# Patient Record
Sex: Female | Born: 1955 | Race: White | Hispanic: No | Marital: Married | Smoking: Never smoker
Health system: Southern US, Community
[De-identification: ages and names within clinical notes are randomized; demographics above are authoritative.]

## PROBLEM LIST (undated history)

## (undated) DIAGNOSIS — K219 Gastro-esophageal reflux disease without esophagitis: Secondary | ICD-10-CM

## (undated) DIAGNOSIS — M8580 Other specified disorders of bone density and structure, unspecified site: Secondary | ICD-10-CM

## (undated) HISTORY — DX: Gastro-esophageal reflux disease without esophagitis: K21.9

## (undated) HISTORY — DX: Other specified disorders of bone density and structure, unspecified site: M85.80

## (undated) HISTORY — PX: OTHER SURGICAL HISTORY: SHX169

---

## 2005-05-15 ENCOUNTER — Ambulatory Visit: Payer: Self-pay | Admitting: Family Medicine

## 2006-04-14 ENCOUNTER — Ambulatory Visit: Payer: Self-pay | Admitting: Unknown Physician Specialty

## 2010-03-26 ENCOUNTER — Ambulatory Visit: Payer: Self-pay | Admitting: Cardiology

## 2011-03-20 ENCOUNTER — Ambulatory Visit: Payer: Self-pay | Admitting: Sports Medicine

## 2011-03-25 ENCOUNTER — Ambulatory Visit (INDEPENDENT_AMBULATORY_CARE_PROVIDER_SITE_OTHER): Payer: BC Managed Care – PPO | Admitting: Sports Medicine

## 2011-03-25 ENCOUNTER — Encounter: Payer: Self-pay | Admitting: *Deleted

## 2011-03-25 VITALS — BP 120/70 | Ht 67.0 in | Wt 130.0 lb

## 2011-03-25 DIAGNOSIS — M2170 Unequal limb length (acquired), unspecified site: Secondary | ICD-10-CM

## 2011-03-25 DIAGNOSIS — M1710 Unilateral primary osteoarthritis, unspecified knee: Secondary | ICD-10-CM

## 2011-03-25 DIAGNOSIS — M79609 Pain in unspecified limb: Secondary | ICD-10-CM

## 2011-03-25 DIAGNOSIS — M79671 Pain in right foot: Secondary | ICD-10-CM | POA: Insufficient documentation

## 2011-03-25 DIAGNOSIS — M1711 Unilateral primary osteoarthritis, right knee: Secondary | ICD-10-CM | POA: Insufficient documentation

## 2011-03-25 NOTE — Assessment & Plan Note (Signed)
We made modifications of her orthotic to some heel lift and also some padding to the right  Her orthotic looks good but if she simply does not have enough relief would make her new more cushioned pair  Try an arch strap for the right foot  This 4-6 weeks but if not improving we will recheck her

## 2011-03-25 NOTE — Patient Instructions (Signed)
Try arch strap and orthotics with added cushions in your work shoes  Please follow up in 1 month if pain persists  Consider trying Devil's claw for arthritis  Do straight leg raises and quad sets for your knee  Glucosamine/chondroitin [employer] at [employer]   Thank you for seeing [place] today!

## 2011-03-25 NOTE — Progress Notes (Signed)
  Subjective:    Patient ID: Caitlin Murray, female    DOB: Nov 19, 1955, 56 y.o.   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

## 2011-03-25 NOTE — Assessment & Plan Note (Signed)
Add some straight leg raises [employer]  Okay to use Voltaren when necessary

## 2011-06-20 ENCOUNTER — Telehealth: Payer: Self-pay | Admitting: Cardiology

## 2011-06-20 NOTE — Telephone Encounter (Signed)
Patient request return call from nurse [phone]  Patient c/o chest & left arm discomfort, SOB over the last several days.  Patient request to talk with nurse to advise, she can be reached at [phone]. I also instructed patient to seek Emergency care if system persist.

## 2011-06-20 NOTE — Telephone Encounter (Signed)
Patient called, stated she went shopping last night,had chest pressure and chest pain.Stated left arm felt tight.Stated lasted most of night.States feels good this morning,no chest pain or pressure.Appointment scheduled with Norma Fredrickson NP 06/24/11.Advised to go to ER if needed.

## 2011-06-24 ENCOUNTER — Other Ambulatory Visit: Payer: Self-pay | Admitting: *Deleted

## 2011-06-24 ENCOUNTER — Encounter: Payer: Self-pay | Admitting: *Deleted

## 2011-06-24 ENCOUNTER — Encounter: Payer: Self-pay | Admitting: Nurse Practitioner

## 2011-06-24 ENCOUNTER — Ambulatory Visit (INDEPENDENT_AMBULATORY_CARE_PROVIDER_SITE_OTHER): Payer: BC Managed Care – PPO | Admitting: Nurse Practitioner

## 2011-06-24 DIAGNOSIS — R0789 Other chest pain: Secondary | ICD-10-CM | POA: Insufficient documentation

## 2011-06-24 DIAGNOSIS — G4730 Sleep apnea, unspecified: Secondary | ICD-10-CM | POA: Insufficient documentation

## 2011-06-24 DIAGNOSIS — R079 Chest pain, unspecified: Secondary | ICD-10-CM

## 2011-06-24 MED ORDER — ASPIRIN EC 81 MG PO TBEC: 81.0000 mg | DELAYED_RELEASE_TABLET | Freq: Every day | ORAL | Status: AC

## 2011-06-24 MED ORDER — OMEPRAZOLE 20 MG PO CPDR: 20.0000 mg | DELAYED_RELEASE_CAPSULE | Freq: Two times a day (BID) | ORAL | Status: DC

## 2011-06-24 NOTE — Progress Notes (Signed)
   Caitlin Murray Date of Birth: 04-09-1955 Medical Record #[id number]  History of Present Illness: Caitlin Murray is seen today for a work in visit. She is seen for Dr. Swaziland. She has a history of GERD, asthma, osteopenia and allergies. She comes in today for evaluation for chest pain. She was last seen in January of 2012.  She notes that last Wednesday night she felt a midsternal chest pressure that was dull. Some radiation down the left arm. No other associated symptoms. She was just walking around leisurely and had been out shopping. It lasted for several hours. No aggravating or alleviating factors. Took an aspirin. Did not check her blood pressure. Went on to sleep that night and felt fine the next day. No recurrence and has none since. Now feels fine. She is concerned about heart disease and wondering how females present with heart related issues. She is normally very active. No symptoms with exertion. She says her cholesterol levels are normal. She has never smoked. Family history is positive for a grandfather having an early MI in his 55's. Strokes are predominant on her mother's side.   Current Outpatient Prescriptions on File Prior to Visit  Medication Sig Dispense Refill  . cetirizine (ZYRTEC) 10 MG tablet Take 10 mg by mouth daily.      . Cholecalciferol (VITAMIN D3) 1000 UNITS CAPS Take 1 capsule by mouth daily.      . mometasone (NASONEX) 50 MCG/ACT nasal spray Place 2 sprays into the nose daily.      Marland Kitchen omeprazole (PRILOSEC) 20 MG capsule Take 1 capsule (20 mg total) by mouth 2 (two) times daily.  60 capsule  6    Allergies  Allergen Reactions  . Demerol   . Dilaudid (Hydromorphone Hcl)     Past Medical History  Diagnosis Date  . Asthma   . GERD (gastroesophageal reflux disease)   . Osteopenia     Past Surgical History  Procedure Date  . Right clavicle repair     History  Smoking status  . Never Smoker   Smokeless tobacco  . Not on file    History  Alcohol Use No     Family History  Problem Relation Age of Onset  . Stroke Mother   . Heart attack Mother   . Emphysema Father   . Hypertension Father   . Hypertension Sister     Review of Systems: The review of systems is positive for rare palpitations. Not dizzy or lightheaded. No syncope. No shortness of breath. No edema. No recent travel. Says she snores a lot and she feels like she has sleep apnea. She has been referred for testing but never followed thru.  All other systems were reviewed and are negative.  Physical Exam: BP 108/78  Pulse 66  Ht 5\' 7"  (1.702 m)  Wt 129 lb (58.514 kg)  BMI 20.20 kg/m2 Patient is very pleasant and in no acute distress. Skin is warm and dry. Color is normal.  HEENT is unremarkable. Normocephalic/atraumatic. PERRL. Sclera are nonicteric. Neck is supple. No masses. No JVD. Lungs are clear. Cardiac exam shows a regular rate and rhythm. Abdomen is soft. Extremities are without edema. Gait and ROM are intact. No gross neurologic deficits noted.  LABORATORY DATA: EKG shows sinus rhythm and is normal.   Assessment / Plan:

## 2011-06-24 NOTE — Assessment & Plan Note (Addendum)
Patient presents with a lone episode of chest pressure last week. Lasted for several hours. Has not recurred. Has had remote stress testing dating back to 2007. Will arrange for GXT. I have asked her to take an aspirin 81 mg daily as well. She does have GERD and I have increased her Prilosec to BID. She says her cholesterol levels are normal and will be having a recheck in the near future.

## 2011-06-24 NOTE — Assessment & Plan Note (Signed)
She is told that she snores significantly. Does not feel rested in the mornings. Has had prior studies arranged but she did not follow thru. She is willing to proceed.

## 2011-06-24 NOTE — Patient Instructions (Addendum)
We are going to arrange for a treadmill test  Start a baby aspirin daily  Increase your Omeprazole to two times a day. I have sent this prescription to the drug store.  We are going to arrange for a sleep study  Call the [facility] Care office at [phone] if you have any questions, problems or concerns.

## 2011-07-11 ENCOUNTER — Ambulatory Visit (INDEPENDENT_AMBULATORY_CARE_PROVIDER_SITE_OTHER): Payer: BC Managed Care – PPO | Admitting: Nurse Practitioner

## 2011-07-11 ENCOUNTER — Encounter: Payer: Self-pay | Admitting: Nurse Practitioner

## 2011-07-11 DIAGNOSIS — R079 Chest pain, unspecified: Secondary | ICD-10-CM

## 2011-07-11 NOTE — Procedures (Signed)
Exercise Treadmill Test  Pre-Exercise Testing Evaluation Rhythm: sinus bradycardia  Rate: 57   PR:  .13 QRS:  .08  QT:  .42 QTc: .41     Test  Exercise Tolerance Test Ordering MD: Peter Swaziland, MD  Interpreting MD: Ward Givens NP  Unique Test No: 1  Treadmill:  1  Indication for ETT: chest pain - rule out ischemia  Contraindication to ETT: No   Stress Modality: exercise - treadmill  Cardiac Imaging Performed: non   Protocol: standard Bruce - maximal  Max BP:  190/84  Max MPHR (bpm):  165 85% MPR (bpm):  140  MPHR obtained (bpm):  166 % MPHR obtained:  100%  Reached 85% MPHR (min:sec):  6:29 Total Exercise Time (min-sec):  10:13  Workload in METS:  12.1 Borg Scale: 15  Reason ETT Terminated:  fatigue    ST Segment Analysis At Rest: normal ST segments - no evidence of significant ST depression With Exercise: non-specific ST changes  Other Information Arrhythmia:  Rare, isolated PVC Angina during ETT:  absent (0) Quality of ETT:  diagnostic  ETT Interpretation:  normal - no evidence of ischemia by ST analysis  Comments: Good exercise tolerance.  Minimal upsloping ST depression II, III, aVF, V3-V6 with exercise.  Hypertensive response to exercise.  Recommendations: F/U Dr. Swaziland.

## 2011-07-12 ENCOUNTER — Telehealth: Payer: Self-pay | Admitting: Cardiology

## 2011-07-12 NOTE — Telephone Encounter (Signed)
I spoke with the patient. It looks as though Elnita Maxwell had tried to call her about the GXT results from yesterday and to make sure the patient had a sleep study scheduled. She is aware of her results and her sleep study is set for 07/15/11.

## 2011-07-12 NOTE — Telephone Encounter (Signed)
Pt rtn call from yesterday °

## 2011-07-15 ENCOUNTER — Ambulatory Visit (HOSPITAL_BASED_OUTPATIENT_CLINIC_OR_DEPARTMENT_OTHER): Payer: BC Managed Care – PPO | Attending: Nurse Practitioner | Admitting: Radiology

## 2011-07-15 VITALS — Ht 67.0 in | Wt 130.0 lb

## 2011-07-15 DIAGNOSIS — G4733 Obstructive sleep apnea (adult) (pediatric): Secondary | ICD-10-CM | POA: Insufficient documentation

## 2011-07-20 DIAGNOSIS — G4733 Obstructive sleep apnea (adult) (pediatric): Secondary | ICD-10-CM

## 2011-07-21 NOTE — Procedures (Signed)
Caitlin Murray, Caitlin Murray               ACCOUNT NO.:  [id number]  MEDICAL RECORD NO.:  [id number]          PATIENT TYPE:  OUT  LOCATION:  SLEEP CENTER                 FACILITY:  [facility]  PHYSICIAN:  Barbaraann Share, MD,FCCPDATE OF BIRTH:  02[birth year]14-1956  DATE OF STUDY:  07/15/2011                           NOCTURNAL POLYSOMNOGRAM  REFERRING PHYSICIAN:  Rosalio Macadamia  REFERRING PHYSICIAN:  Dante Gang, N.P.  INDICATION FOR STUDY:  Hypersomnia with sleep apnea.  EPWORTH SLEEPINESS SCORE:  6.  SLEEP ARCHITECTURE:  The patient had total sleep time of 346 minutes with decreased slow wave sleep as well as REM.  Sleep onset latency was normal at 15 minutes, and REM onset was normal at 77 minutes.  Sleep efficiency was mildly reduced at 87%.  RESPIRATORY DATA:  The patient was found to have 28 apneas and 13 obstructive hypopneas, giving her an apnea/hypopnea index of only 7 events per hour.  The events were more common in the supine position and also during REM.  Moderate snoring was noted throughout.  The patient did not meet split night protocol secondary to the majority of her events occurring after 1 a.m.  OXYGEN DATA:  There was transient O2 desaturation as low as 82% with the patient's obstructive events.  CARDIAC DATA:  Occasional PVC and PAC noted, but no clinically significant arrhythmias were seen.  MOVEMENT/PARASOMNIA:  The patient had no significant leg jerks or other abnormal behaviors noted.  IMPRESSION/RECOMMENDATION: 1. Very mild obstructive sleep apnea/hypopnea syndrome with an AHI of     7 events per hour and oxygen desaturation as low as 82%.  Treatment     for this degree of sleep apnea can include a trial of weight loss     if applicable, upper airway surgery, dental appliance, and also     CPAP.  This mild degree of sleep apnea most likely has very little     impact on her cardiovascular health, and therefore the decision to     treat aggressively should  primarily be based on its impact to her     quality of life 2. Occasional premature atrial contractions and premature ventricular     contractions noted, but no clinically significant arrhythmias were     seen.     Barbaraann Share, MD,FCCP Diplomate, American Board of Sleep Medicine    [facility]/MEDQ  D:  07/20/2011 17:59:29  T:  07/21/2011 01:47:39  Job:  [id number]

## 2011-07-30 ENCOUNTER — Ambulatory Visit: Payer: Self-pay | Admitting: Unknown Physician Specialty

## 2011-07-31 ENCOUNTER — Telehealth: Payer: Self-pay | Admitting: Cardiology

## 2011-07-31 NOTE — Telephone Encounter (Signed)
New msg Pt called and wanted sleep study results she had few weeks ago

## 2011-07-31 NOTE — Telephone Encounter (Signed)
Patient called was told sleep study report not available yet.Patient advised if I have not called her within the next 2 weeks please call back.

## 2011-08-29 ENCOUNTER — Telehealth: Payer: Self-pay | Admitting: Cardiology

## 2011-08-29 NOTE — Telephone Encounter (Signed)
Patient called was told will show sleep study to Norma Fredrickson NP and call her back tomorrow 08/30/11.

## 2011-08-29 NOTE — Telephone Encounter (Signed)
Pt calling re results of sleep study

## 2011-08-30 NOTE — Telephone Encounter (Signed)
Patient called no answer.Left message on personal voice mail sleep study has not been read yet, will call her back when it has been read.

## 2011-09-09 ENCOUNTER — Telehealth: Payer: Self-pay | Admitting: Cardiology

## 2011-09-09 NOTE — Telephone Encounter (Signed)
Patient called was told Dr.Jordan not in office this afternoon.Will show Dr.Jordan sleep study report when he is office this week and will call her back.

## 2011-09-09 NOTE — Telephone Encounter (Signed)
New msg Pt wants to know about sleep study results

## 2011-09-11 ENCOUNTER — Telehealth: Payer: Self-pay

## 2011-09-11 NOTE — Telephone Encounter (Signed)
Patient called was told sleep study revealed mild obstructive sleep apnea.No treatment except loss weight.Patient stated she only weighed 130 lbs.Patient requested a copy of report be faxed to Dr.Martin Senior in [place].

## 2011-09-12 ENCOUNTER — Telehealth: Payer: Self-pay | Admitting: Cardiology

## 2011-09-12 NOTE — Telephone Encounter (Signed)
Sleep study faxed to Dr.Brent Senior at fax # [phone].

## 2011-09-12 NOTE — Telephone Encounter (Signed)
Patient called, sleep study results should be faxed to Dr. Kipp Brood Senior Fax # [phone].

## 2011-09-12 NOTE — Telephone Encounter (Signed)
Already spoke to patient.Dr.Jordan reviewed sleep study which revealed mild sleep apnea.Copy faxed to Dr.Brent Senior in [place].

## 2012-08-04 ENCOUNTER — Other Ambulatory Visit: Payer: Self-pay | Admitting: *Deleted

## 2012-08-04 MED ORDER — OMEPRAZOLE 20 MG PO CPDR: 20.0000 mg | DELAYED_RELEASE_CAPSULE | Freq: Two times a day (BID) | ORAL | Status: DC

## 2013-01-07 ENCOUNTER — Encounter: Payer: Self-pay | Admitting: Sports Medicine

## 2013-01-07 ENCOUNTER — Ambulatory Visit (INDEPENDENT_AMBULATORY_CARE_PROVIDER_SITE_OTHER): Payer: BC Managed Care – PPO | Admitting: Sports Medicine

## 2013-01-07 VITALS — BP 113/72 | Ht 67.0 in | Wt 128.0 lb

## 2013-01-07 DIAGNOSIS — M79671 Pain in right foot: Secondary | ICD-10-CM

## 2013-01-07 DIAGNOSIS — M79609 Pain in unspecified limb: Secondary | ICD-10-CM

## 2013-01-07 NOTE — Progress Notes (Signed)
Patient ID: Caitlin Murray, female   DOB: 04-Jan-1956, 57 y.o.   MRN: [id number] Right great toe pain, worse with ambulation.  Progressively worsening for one month.  History of midfoot arthritis.  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  Tried plate in shoe in past which did not provide relief.  Has tried wider shoes with little relief as well.  Denies prior injury.    Past Medical History  Diagnosis Date  . Asthma   . GERD (gastroesophageal reflux disease)   . Osteopenia    Past Surgical History  Procedure Laterality Date  . Right clavicle repair     Allergies  Allergen Reactions  . Demerol   . Dilaudid [Hydromorphone Hcl]    ROS:  As per HPI, otherwise negative.  Examination: BP 113/72  Ht 5\' 7"  (1.702 m)  Wt 128 lb (58.06 kg)  BMI 20.04 kg/m2 Well developed, well nourished 57 you white female A&Ox3.  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  Genu varus knees R>L

## 2013-01-07 NOTE — Assessment & Plan Note (Signed)
1st ray semirigid posting added to insole of boot today in addition to small scaphoid pad. She will bring this in with other insole/boots as needed and re-eval if symptoms do not improve or worsen.

## 2013-02-11 ENCOUNTER — Ambulatory Visit: Payer: BC Managed Care – PPO | Admitting: Sports Medicine

## 2013-02-23 ENCOUNTER — Ambulatory Visit: Payer: BC Managed Care – PPO | Admitting: Sports Medicine

## 2013-07-20 ENCOUNTER — Ambulatory Visit: Payer: BC Managed Care – PPO | Admitting: Sports Medicine

## 2014-01-08 ENCOUNTER — Ambulatory Visit: Payer: Self-pay | Admitting: Physician Assistant

## 2014-01-15 ENCOUNTER — Ambulatory Visit: Payer: Self-pay | Admitting: Emergency Medicine

## 2015-03-27 ENCOUNTER — Ambulatory Visit (INDEPENDENT_AMBULATORY_CARE_PROVIDER_SITE_OTHER): Payer: BC Managed Care – PPO | Admitting: Family Medicine

## 2015-03-27 VITALS — BP 94/66 | HR 92 | Temp 98.3°F | Resp 16 | Ht 67.5 in | Wt 123.0 lb

## 2015-03-27 DIAGNOSIS — J101 Influenza due to other identified influenza virus with other respiratory manifestations: Secondary | ICD-10-CM | POA: Diagnosis not present

## 2015-03-27 LAB — POCT INFLUENZA A/B
INFLUENZA B, POC: NEGATIVE
Influenza A, POC: POSITIVE — AB

## 2015-03-27 MED ORDER — DM-GUAIFENESIN ER 30-600 MG PO TB12: 1.0000 | ORAL_TABLET | Freq: Two times a day (BID) | ORAL | Status: DC

## 2015-03-27 MED ORDER — BENZONATATE 100 MG PO CAPS: 100.0000 mg | ORAL_CAPSULE | Freq: Three times a day (TID) | ORAL | Status: DC | PRN

## 2015-03-27 MED ORDER — OSELTAMIVIR PHOSPHATE 75 MG PO CAPS: 75.0000 mg | ORAL_CAPSULE | Freq: Two times a day (BID) | ORAL | Status: DC

## 2015-03-27 NOTE — Patient Instructions (Signed)

## 2015-03-27 NOTE — Progress Notes (Signed)
Subjective:    Patient ID: Caitlin Murray, female    DOB: 28-Sep-1955, 60 y.o.   MRN: [id number]  HPI: Caitlin Murray is a 60 y.o. female presenting on 03/27/2015 for Cough   HPI  Pt presents for 4 days of fever, chills, body aches, and cough. Symptoms began on Friday. Cough is worse symptoms. Fatigue. Fever 101 at home. She did get flu shot this year.  Home treatment: Decongestant, Advil. No cough medication.   Past Medical History  Diagnosis Date  . Asthma   . GERD (gastroesophageal reflux disease)   . Osteopenia     Current Outpatient Prescriptions on File Prior to Visit  Medication Sig  . mometasone (NASONEX) 50 MCG/ACT nasal spray Place 2 sprays into the nose daily.   No current facility-administered medications on file prior to visit.    Review of Systems  Constitutional: Positive for fever, chills and fatigue.  HENT: Positive for congestion and rhinorrhea. Negative for sore throat and trouble swallowing.   Respiratory: Positive for cough. Negative for chest tightness and wheezing.   Cardiovascular: Negative for chest pain, palpitations and leg swelling.  Gastrointestinal: Negative for nausea, vomiting and diarrhea.  Musculoskeletal: Negative for neck pain and neck stiffness.  Skin: Negative for color change and rash.  Neurological: Positive for headaches.   Per HPI unless specifically indicated above     Objective:    BP 94/66 mmHg  Pulse 92  Temp(Src) 98.3 F (36.8 C) (Oral)  Resp 16  Ht 5' 7.5" (1.715 m)  Wt 123 lb (55.792 kg)  BMI 18.97 kg/m2  SpO2 97%  Wt Readings from Last 3 Encounters:  03/27/15 123 lb (55.792 kg)  01/07/13 128 lb (58.06 kg)  07/15/11 130 lb (58.968 kg)    Physical Exam  Constitutional: She is oriented to person, place, and time. She appears well-developed and well-nourished. She appears ill. No distress.  HENT:  Head: Normocephalic and atraumatic.  Right Ear: Hearing and tympanic membrane normal.  Left Ear: Hearing and  tympanic membrane normal.  Nose: Mucosal edema and rhinorrhea present. Right sinus exhibits no maxillary sinus tenderness and no frontal sinus tenderness. Left sinus exhibits no maxillary sinus tenderness and no frontal sinus tenderness.  Mouth/Throat: Uvula is midline and mucous membranes are normal. Posterior oropharyngeal erythema present.  Neck: Normal range of motion. No erythema present. No Brudzinski's sign and no Kernig's sign noted.  Cardiovascular: Normal rate and regular rhythm.  Exam reveals no gallop and no friction rub.   No murmur heard. Pulmonary/Chest: Effort normal and breath sounds normal. No respiratory distress. She has no decreased breath sounds. She has no wheezes. She has no rhonchi. She has no rales. Chest wall is not dull to percussion. She exhibits no tenderness.  Lymphadenopathy:    She has cervical adenopathy.       Right cervical: Superficial cervical adenopathy present.       Left cervical: Superficial cervical adenopathy present.  Neurological: She is alert and oriented to person, place, and time.  Skin: Skin is warm and dry. No rash noted. She is not diaphoretic. No erythema. No pallor.  Psychiatric: She has a normal mood and affect. Her speech is normal and behavior is normal.   Results for orders placed or performed in visit on 03/27/15  POCT Influenza A/B  Result Value Ref Range   Influenza A, POC Positive (A) Negative   Influenza B, POC Negative Negative      Assessment & Plan:   Problem List  Items Addressed This Visit    None    Visit Diagnoses    Influenza A    -  Primary    Treat symptoms. Trial of tamiflu despite 4 days of symptoms. Cough medication and supportive care. Alarm symptoms reviewed.     Relevant Medications    oseltamivir (TAMIFLU) 75 MG capsule    benzonatate (TESSALON) 100 MG capsule    dextromethorphan-guaiFENesin (MUCINEX DM) 30-600 MG 12hr tablet    Other Relevant Orders    POCT Influenza A/B (Completed)       Meds  ordered this encounter  Medications  . Ibuprofen-Famotidine 800-26.6 MG TABS    Sig: Take by mouth.  . oseltamivir (TAMIFLU) 75 MG capsule    Sig: Take 1 capsule (75 mg total) by mouth 2 (two) times daily.    Dispense:  10 capsule    Refill:  0    Order Specific Question:  Supervising Provider    Answer:  Janeann Forehand [phone]  . benzonatate (TESSALON) 100 MG capsule    Sig: Take 1 capsule (100 mg total) by mouth 3 (three) times daily as needed.    Dispense:  30 capsule    Refill:  0    Order Specific Question:  Supervising Provider    Answer:  Janeann Forehand [[id number]]  . dextromethorphan-guaiFENesin (MUCINEX DM) 30-600 MG 12hr tablet    Sig: Take 1 tablet by mouth 2 (two) times daily.    Dispense:  20 tablet    Refill:  0    Order Specific Question:  Supervising Provider    Answer:  Janeann Forehand [[id number]]      Follow up plan: Return if symptoms worsen or fail to improve.

## 2015-03-29 ENCOUNTER — Telehealth: Payer: Self-pay | Admitting: Family Medicine

## 2015-03-29 ENCOUNTER — Ambulatory Visit
Admission: RE | Admit: 2015-03-29 | Discharge: 2015-03-29 | Disposition: A | Discharge status: Home / Self Care | Payer: BC Managed Care – PPO | Source: Ambulatory Visit | Attending: Family Medicine | Admitting: Family Medicine

## 2015-03-29 ENCOUNTER — Other Ambulatory Visit: Payer: Self-pay | Admitting: *Deleted

## 2015-03-29 DIAGNOSIS — R05 Cough: Secondary | ICD-10-CM

## 2015-03-29 DIAGNOSIS — R059 Cough, unspecified: Secondary | ICD-10-CM

## 2015-03-29 MED ORDER — PROMETHAZINE-CODEINE 6.25-10 MG/5ML PO SYRP: 5.0000 mL | ORAL_SOLUTION | Freq: Three times a day (TID) | ORAL | Status: DC | PRN

## 2015-03-29 NOTE — Telephone Encounter (Signed)
Please advise 

## 2015-03-29 NOTE — Addendum Note (Signed)
Addended by: Alease Frame on: 03/29/2015 03:02 PM   Modules accepted: Orders

## 2015-03-29 NOTE — Telephone Encounter (Signed)
Patient will come in for cxr. She is still having yellow mucus but no fever.

## 2015-03-29 NOTE — Telephone Encounter (Signed)
The flu can cause quite a severe cough. We do however worry about pneumonia developing from the flu.  Is the cough now productive of sputum? Is the sputum green, yellow, or rust colored? Is she still having high fevers? If yes to any of those, we may need to see her again do a chest XR to ensure she does not have pneumonia. We may also need to start an antibiotic to help with her symptoms.   The hoarseness can be caused by viral inflammation with the flu. Warm salt water gargles 4 times daily. Hot drinks such as tea, and losenges can help alleviate the symptoms.

## 2015-03-29 NOTE — Telephone Encounter (Signed)
Pt's husband Roe Coombs said she is still coughing and can't talk because she's so hoarse.  Please call [phone]

## 2015-03-29 NOTE — Telephone Encounter (Signed)
CXR is normal. Pt would like to try medication with codiene for her cough. She can take with no problem in the past. Faxed to pharmacy. If she is still feeling poorly on Friday, we will start an antibiotic to help with her symptoms.

## 2015-03-31 ENCOUNTER — Telehealth: Payer: Self-pay | Admitting: Family Medicine

## 2015-03-31 DIAGNOSIS — J40 Bronchitis, not specified as acute or chronic: Secondary | ICD-10-CM

## 2015-03-31 MED ORDER — AZITHROMYCIN 250 MG PO TABS: ORAL_TABLET | ORAL | Status: DC

## 2015-03-31 MED ORDER — PREDNISONE 20 MG PO TABS: 40.0000 mg | ORAL_TABLET | Freq: Every day | ORAL | Status: DC

## 2015-03-31 NOTE — Telephone Encounter (Signed)
Please let the patient know, that I have sent Azithromycin over to her pharmacy. Take 2 pills today and 1 pill daily until the bottle is empty.

## 2015-03-31 NOTE — Telephone Encounter (Signed)
Pt is still coughing.  Please call [phone]

## 2015-03-31 NOTE — Telephone Encounter (Signed)
Called pt, start prednisone and Zpak. Call on Monday if not improved. Alarm symptoms reviewed.

## 2015-04-03 ENCOUNTER — Telehealth: Payer: Self-pay | Admitting: Family Medicine

## 2015-04-03 NOTE — Telephone Encounter (Signed)
Pt started medication on Friday night and just wanted to report that she is still coughing.  Her call back number is [phone]

## 2015-04-03 NOTE — Telephone Encounter (Signed)
I also gave her an antibiotic to treat for infection just in case.  The cough can linger for a few weeks after the initial illness. It may take some time for the cough to go away. The prednisone should help reduce any inflammation and irritation that is leading the cough.   She can use OTC cough suppressants. Mucinex DM, delsym, Humibid air to help with symptoms.    If she is not short of breath, wheezing, or having fevers it is likely a lingering cough from the flu.

## 2015-04-03 NOTE — Telephone Encounter (Signed)
Pt advised as per Amy. 

## 2015-04-03 NOTE — Telephone Encounter (Signed)
Patient was diagnosed with the Flu. She has been given Tamiflu, Prednisone, Codeine cough syrup and Tessalon perles. She called to report she is still coughing. Please advise.

## 2015-05-30 ENCOUNTER — Ambulatory Visit (INDEPENDENT_AMBULATORY_CARE_PROVIDER_SITE_OTHER): Payer: BC Managed Care – PPO | Admitting: Family Medicine

## 2015-05-30 ENCOUNTER — Ambulatory Visit
Admission: RE | Admit: 2015-05-30 | Discharge: 2015-05-30 | Disposition: A | Discharge status: Home / Self Care | Payer: BC Managed Care – PPO | Source: Ambulatory Visit | Attending: Family Medicine | Admitting: Family Medicine

## 2015-05-30 ENCOUNTER — Other Ambulatory Visit: Payer: Self-pay | Admitting: Family Medicine

## 2015-05-30 VITALS — BP 112/74 | HR 63 | Temp 97.7°F | Resp 16 | Ht 67.5 in | Wt 121.1 lb

## 2015-05-30 DIAGNOSIS — R059 Cough, unspecified: Secondary | ICD-10-CM

## 2015-05-30 DIAGNOSIS — R05 Cough: Secondary | ICD-10-CM | POA: Diagnosis not present

## 2015-05-30 DIAGNOSIS — G4730 Sleep apnea, unspecified: Secondary | ICD-10-CM | POA: Diagnosis not present

## 2015-05-30 DIAGNOSIS — R634 Abnormal weight loss: Secondary | ICD-10-CM | POA: Diagnosis not present

## 2015-05-30 DIAGNOSIS — J301 Allergic rhinitis due to pollen: Secondary | ICD-10-CM | POA: Diagnosis not present

## 2015-05-30 MED ORDER — RANITIDINE HCL 150 MG PO CAPS: 150.0000 mg | ORAL_CAPSULE | Freq: Every evening | ORAL | Status: DC

## 2015-05-30 MED ORDER — LORATADINE-PSEUDOEPHEDRINE ER 10-240 MG PO TB24: 1.0000 | ORAL_TABLET | Freq: Every day | ORAL | Status: DC

## 2015-05-30 NOTE — Patient Instructions (Signed)
I think your cough is coming from either allergies or acid reflux.  Let's try treating for both.  I want you to take your nasonex everyday. Add a claritin D- take once daily for allergies and congestion. If you do not like the added decongestant, you can take just a plain claritin OTC. Take Zanatac once daily at bedtime to help with any acid reflux symptoms.  Please seek immediate medical attention if you develop shortness of breath not relieve by inhaler, chest pain/tightness, fever > 103 F or other concerning symptoms.

## 2015-05-30 NOTE — Assessment & Plan Note (Signed)
Pt wants to try CPAP again. Will correspond with [facility] to determine if she can get a machine and auto-titrate or if she needs new study. Failed dental device.

## 2015-05-30 NOTE — Progress Notes (Signed)
Subjective:    Patient ID: Caitlin Murray, female    DOB: 03/20/1955, 60 y.o.   MRN: [id number]  HPI: Caitlin Murray is a 60 y.o. female presenting on 05/30/2015 for Cough   HPI  Pt presents for cough x 2 mos. She had the flu in January and cough never fully resolved. Had a few days where it would go away and then come back. No fever. Not coughing up anything. Dry cough. No chest tightness. No trouble breath.  Not keeping her awake at night. Feels it is not as bad as it was when she was ill. Has some post nasal drip. Says her teeth her. Some facial pain- but not consistent. No nasal drainage.  Takes nasonex for allergies. Not everyday. Does not take claritin daily.  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   Pt has a concern about sleep apnea. [facility] Had sleep study in 2014 but could not tolerate the PAP study. She wants to try again.  OBTW was patient leaving- I have lost 8lbs without trying.   Past Medical History  Diagnosis Date  . Asthma   . GERD (gastroesophageal reflux disease)   . Osteopenia     Current Outpatient Prescriptions on File Prior to Visit  Medication Sig  . Ibuprofen-Famotidine 800-26.6 MG TABS Take by mouth.  . mometasone (NASONEX) 50 MCG/ACT nasal spray Place 2 sprays into the nose daily.   No current facility-administered medications on file prior to visit.    Review of Systems  Constitutional: Positive for unexpected weight change (loss of 8lbs.). Negative for fever and chills.  HENT: Positive for postnasal drip, rhinorrhea and sinus pressure.   Respiratory: Positive for cough. Negative for chest tightness, shortness of breath and wheezing.   Cardiovascular: Negative for chest pain and leg swelling.  Gastrointestinal: Negative for nausea, vomiting, abdominal pain, diarrhea and constipation.  Endocrine: Negative.  Negative for cold intolerance, heat intolerance, polydipsia, polyphagia and polyuria.    Genitourinary: Negative for dysuria and difficulty urinating.  Musculoskeletal: Negative.   Neurological: Negative for dizziness, light-headedness and numbness.  Psychiatric/Behavioral: Negative.    Per HPI unless specifically indicated above     Objective:    BP 112/74 mmHg  Pulse 63  Temp(Src) 97.7 F (36.5 C) (Oral)  Resp 16  Ht 5' 7.5" (1.715 m)  Wt 121 lb 1.6 oz (54.931 kg)  BMI 18.68 kg/m2  SpO2 100%  Wt Readings from Last 3 Encounters:  05/30/15 121 lb 1.6 oz (54.931 kg)  03/27/15 123 lb (55.792 kg)  01/07/13 128 lb (58.06 kg)    Physical Exam  Constitutional: She is oriented to person, place, and time. She appears well-developed and well-nourished.  HENT:  Head: Normocephalic and atraumatic.  Right Ear: Hearing and tympanic membrane normal.  Left Ear: Hearing normal. A middle ear effusion is present.  Nose: Rhinorrhea present. Right sinus exhibits no maxillary sinus tenderness and no frontal sinus tenderness. Left sinus exhibits no maxillary sinus tenderness and no frontal sinus tenderness.  Mouth/Throat: Mucous membranes are normal.  Boggy nasal turbinates.   Neck: Neck supple.  Cardiovascular: Normal rate, regular rhythm and normal heart sounds.  Exam reveals no gallop and no friction rub.   No murmur heard. Pulmonary/Chest: Effort normal and breath sounds normal. She has no decreased breath sounds. She has no wheezes. She has no rhonchi. She has no rales. Chest wall is not dull to percussion. She exhibits no tenderness.  Abdominal:  Soft. Normal appearance and bowel sounds are normal. She exhibits no distension and no mass. There is no tenderness. There is no rebound and no guarding.  Musculoskeletal: Normal range of motion. She exhibits no edema or tenderness.  Lymphadenopathy:    She has no cervical adenopathy.  Neurological: She is alert and oriented to person, place, and time.  Skin: Skin is warm and dry.   Results for orders placed or performed in visit  on 03/27/15  POCT Influenza A/B  Result Value Ref Range   Influenza A, POC Positive (A) Negative   Influenza B, POC Negative Negative      Assessment & Plan:   Problem List Items Addressed This Visit      Respiratory   Allergic rhinitis due to pollen    Claritin D and nasonex daily to help with symptoms. Can switch to normal claritin when decongestant not needed. Ideally this will resolve cough symptoms.       Relevant Medications   loratadine-pseudoephedrine (CLARITIN-D 24-HOUR) 10-240 MG 24 hr tablet     Other   Sleep apnea    Pt wants to try CPAP again. Will correspond with [facility] to determine if she can get a machine and auto-titrate or if she needs new study. Failed dental device.        Other Visit Diagnoses    Cough    -  Primary    Upperairway cough vs. PND vs. GERD. Treat clear allergic rhinitis symptoms treat for upper airway and GERD. R/o pulm dz with CXR. Encouraged home treatment of cough. Consider pulm referral if not resolving. Recheck 1 mos.     Relevant Medications    ranitidine (ZANTAC) 150 MG capsule    Other Relevant Orders    DG Chest 2 View    Weight loss        Mentioned on way to Xr. Will plan to get further history and consider work-up at follow-up visit.        Meds ordered this encounter  Medications  . loratadine-pseudoephedrine (CLARITIN-D 24-HOUR) 10-240 MG 24 hr tablet    Sig: Take 1 tablet by mouth daily.    Dispense:  30 tablet    Refill:  1  . ranitidine (ZANTAC) 150 MG capsule    Sig: Take 1 capsule (150 mg total) by mouth every evening.    Dispense:  30 capsule    Refill:  11      Follow up plan: Return in about 4 weeks (around 06/27/2015) for cough.

## 2015-05-30 NOTE — Assessment & Plan Note (Signed)
Claritin D and nasonex daily to help with symptoms. Can switch to normal claritin when decongestant not needed. Ideally this will resolve cough symptoms.

## 2015-10-19 ENCOUNTER — Ambulatory Visit (INDEPENDENT_AMBULATORY_CARE_PROVIDER_SITE_OTHER): Payer: BC Managed Care – PPO | Admitting: Sports Medicine

## 2015-10-19 ENCOUNTER — Encounter: Payer: Self-pay | Admitting: Sports Medicine

## 2015-10-19 DIAGNOSIS — M2021 Hallux rigidus, right foot: Secondary | ICD-10-CM | POA: Diagnosis not present

## 2015-10-19 NOTE — Assessment & Plan Note (Signed)
We can try topicals  We made additions on orthotics to float MTP 1 and see if that helps pain

## 2015-10-19 NOTE — Progress Notes (Signed)
Subjective:    Patient ID: Caitlin Murray , female   DOB: 09/13/1955 , 60 y.o..   MRN: [id number]  HPI  Caitlin SandiferSusan Loy Devoss is here for left scapula pain and right great toe pain.  LEFT scapula pain:  Patient states this pain started a few weeks ago. Denies any trauma to the area although she did fall off her horse in January (went to the doctor and was told she had no injuries). 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 The pain is "achy" and particularly bad if she sleeps no her right side. No other aggravating factors identified. Alleviating factors include Ibuprofen which she takes once a day. She is right hand dominant. Denies numbness, tingling, weakness, neck pain, or back pain.   RIGHT great toe pain:  Last seen in 2014 for this pain. A 1st ray semirigid posting was added to her insole in addition to a small scaphoid pad. Her pain seemed to improve over the years with these insole inserts but recently it has been bothering her again. 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 She has bought more rounded toe boots.  Review of Systems: Per HPI. All other systems reviewed and are negative.  Past Medical History: Patient Active Problem List   Diagnosis Date Noted  . Allergic rhinitis due to pollen 05/30/2015  . Atypical chest pain 06/24/2011  . Sleep apnea 06/24/2011  . Foot pain, right 03/25/2011  . Right knee DJD 03/25/2011  . Leg length inequality 03/25/2011    Medications: reviewed and updated Current Outpatient Prescriptions  Medication Sig Dispense Refill  . naproxen sodium (ANAPROX) 220 MG tablet Take by mouth.    Marland Kitchen. omeprazole (PRILOSEC) 20 MG capsule Take 20 mg by mouth.    . Ibuprofen-Famotidine 800-26.6 MG TABS Take by mouth.    . loratadine-pseudoephedrine (CLARITIN-D 24-HOUR) 10-240 MG 24 hr tablet Take 1 tablet by mouth daily. 30 tablet 1  . mometasone  (NASONEX) 50 MCG/ACT nasal spray Place 2 sprays into the nose daily.    . ranitidine (ZANTAC) 150 MG capsule Take 1 capsule (150 mg total) by mouth every evening. 30 capsule 11   No current facility-administered medications for this visit.     Social Hx:  reports that she has never smoked. She does not have any smokeless tobacco history on file.    Objective:   BP 117/66   Pulse 68   Ht 5\' 7"  (1.702 m)   Wt 125 lb (56.7 kg)   BMI 19.58 kg/m  Physical Exam  Gen: NAD, alert, cooperative with exam, well-appearing MSK:   Left Shoulder: Inspection reveals no abnormalities, atrophy or asymmetry. Palpation is normal with no tenderness over AC joint or bicipital groove. Clicking sensation felt over left scapula with movement. ROM is full in all planes. Rotator cuff strength normal throughout. No signs of impingement with negative Neer and Hawkin's tests, empty can sign. Speeds and Yergason's tests normal. No labral pathology noted with negative Obrien's, negative clunk and good stability. Scapular imbalance with left scapula, medial border more prominent on the left compared to the right.  No painful arc and no drop arm sign. No apprehension sign  Right shoulder  normal exam, no swelling, tenderness, instability; ligaments intact, FROM shoulder joint  Right Foot/ankle exam:  Inspection: No swelling or ecchymosis. Hallux valgus. Lat rotation appearance of digit 4+5. Callus on medial border of  great toe . Normal long arch.  Palpation: Non-tender to palpation throughout Range of Motion: limited at 20 degrees / Lt GRT toe shows 80 deg of motion Strength: 5/5 dorsiflexion and plantarflexion Neurovascularly intact  Left Foot/ankle exam:  Inspection: No swelling, ecchymosis or deformities. Normal longl arch.  Palpation: Non-tender to palpation throughout Range of Motion: FROM all ankle/foot joints Strength: 5/5 dorsiflexion and plantarflexion Neurovascularly intact   Assessment &  Plan:   Left scapula pain: Likely due to left scapular imbalance  -Will need strengthening exercises that include; starting lawnmower, High and low row, Robbery, inferior glide, and T + H stretching - Continue Ibuprofen PRN - Follow up as needed  Right Hallux rigidus - changed insole of right foot and placed L pad  - Voltaren gel PRN for pain   Anders Simmondshristina Gambino, MD [facility], PGY-2  Agree with A/P.  Supervised.  Sterling BigKB Fields, MD

## 2015-12-11 ENCOUNTER — Encounter: Payer: Self-pay | Admitting: Sports Medicine

## 2015-12-11 ENCOUNTER — Ambulatory Visit (INDEPENDENT_AMBULATORY_CARE_PROVIDER_SITE_OTHER): Payer: BC Managed Care – PPO | Admitting: Sports Medicine

## 2015-12-11 DIAGNOSIS — M79671 Pain in right foot: Secondary | ICD-10-CM

## 2015-12-11 DIAGNOSIS — M2021 Hallux rigidus, right foot: Secondary | ICD-10-CM

## 2015-12-11 MED ORDER — GABAPENTIN 300 MG PO CAPS: 300.0000 mg | ORAL_CAPSULE | Freq: Every day | ORAL | 0 refills | Status: DC

## 2015-12-11 NOTE — Progress Notes (Signed)
Subjective:  Patient ID: Caitlin Murray, female    DOB: 11/14/1955  Age: 60 y.o. MRN: [id number]  CC: Right Big Toe Pain and Left Shoulder Numbness  HPI Caitlin Murray is 60 year old female who presents today with right toe pain and left shoulder numbness. Patient was initially scheduling appointment for lower back pain, but patient says that it resolved a day after she made the appointment. Patient complains of right big toe pain that has been going on for years. Patient says that she has been told it was arthritis of her big toe. Patient says that it is swollen at times and tender to touch sometimes. Patient reports that the pain is localized to the big toe. She reports that she doesn't have numbness or tingling but her toe "feels funny". She relieves the pain with asper cream w/ lidocaine. Patient also reports using Duexis to help alleviate pain as needed (an average of every other day).  Patient denies having a history of gout  Patient reports having numbness and tightness with her left shoulder. Patient reports that the numbness occurs about every 2-3 days and her entire arm feels numb and tight. Patient reports that this tends to occur in the mornings a good amount of time after getting up. These episodes last for about and hour and then the numbness and tightness goes away. She was given shoulder exercises the last time she came on 8/17. She feels like the shoulder exercises have not helped with her problem.   ROS Review of Systems  Musculoskeletal:       Right Big Toe Swelling      Right Big Toe Pain      Right Big Toe Numbness      Left Shoulder Numbness  All other systems reviewed and are negative.   Objective:  BP 116/71   Ht 5' 7.5" (1.715 m)   Wt 127 lb (57.6 kg)   BMI 19.60 kg/m   Physical Exam  Constitutional: She appears well-developed and well-nourished.  Musculoskeletal:  Right Foot: Mild swelling present, hallux valgus present. No tenderness. Limited  dorsiflexion and plantarflexion of big toe. Neurovascularly intact. Left Shoulder: No swelling, lesions, or deformities. No tenderness. Normal ROM. Normal Strength. Negative Empty Can test. Neurovascularly intact. Right Shoulder: No swelling, lesions, or deformities. No tenderness. Normal ROM. Normal Strength. Negative Empty Can test. Neurovascularly intact.     Assessment & Plan:   1. 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 We gave patient a modified steel shank and general green insert for cushioning to pair with it. Patient reports having current pair of custom orthotics for years. We would like to make her a new pair of orthotics. Patient agreed. We will follow up in about 2-3 weeks to make a new pair of orthotics for patient.   1. Left arm numbness secondary to possible cervical impingement  Given the negative shoulder exam and the left shoulder/neck tightness that the patient reported we believe that the patient is having impingement on one of her cervical nerves. Patient denies any weakness in arm. Offered patient a trial of gabapentin to see if it helps with the numbness. Patient elected to try a low dose of  gabapentin. Will follow up with patient in 2-3 weeks to discuss if the medication is working.   Meds ordered this encounter  Medications  . gabapentin (NEURONTIN) 300 MG capsule  Sig: Take 1 capsule (300 mg total) by mouth at bedtime.    Dispense:  60 capsule    Refill:  0    Follow-up: 2-3 weeks  Ardyth HarpsJohn Adeolu Vernetta Dizdarevic, Medical Student  Patient seen and evaluated with the medical student. I agree with the above plan of care. Patient will return to the office in 2-3 weeks for custom orthotics. She has definite hallux rigidus of her right first MTP joint. In regards to her left arm numbness, this is likely coming from her cervical spine. We will see if the gabapentin helps.

## 2016-01-02 ENCOUNTER — Ambulatory Visit (INDEPENDENT_AMBULATORY_CARE_PROVIDER_SITE_OTHER): Payer: BC Managed Care – PPO | Admitting: Sports Medicine

## 2016-01-02 ENCOUNTER — Encounter: Payer: Self-pay | Admitting: Sports Medicine

## 2016-01-02 DIAGNOSIS — M2021 Hallux rigidus, right foot: Secondary | ICD-10-CM

## 2016-01-02 NOTE — Assessment & Plan Note (Signed)
Today we prepared custom orthotics She should wear these as much as possible The plan to take pressure off the great toe at the longitudinal arch If necessary we can add additional first ray posting  After preparation of the orthotic she had a neutral walking gait and felt comfortable  We will try this for the next few months and recheck pending response

## 2016-01-02 NOTE — Progress Notes (Signed)
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 Margaretha Sheffieldraper used a steel shank underneath a sports insole This has given her some pain relief Her older orthotics are not comfortable She has significant hallux rigidus on the right We have documented that on ultrasound and examination  Today she comes for orthotic preparation that she can move in and out of different boots  Review of systems No joint swelling No redness No history of gout  Physical examination Patient is in no acute distress BP 130/61   Ht 5' 7.5" (1.715 m)   Wt 125 lb (56.7 kg)   BMI 19.29 kg/m  Moderate longitudinal arch Foot exam is as noted prior Right first MTP shows limited extension and flexion There is tenderness to palpation on the dorsum No swelling  Procedure note: Patient was fitted for a : standard, cushioned, semi-rigid orthotic. The orthotic was heated and afterward the patient stood on the orthotic blank positioned on the orthotic stand. The patient was positioned in subtalar neutral position and 10 degrees of ankle dorsiflexion in a weight bearing stance. After completion of molding, a stable base was applied to the orthotic blank. The blank was ground to a stable position for weight bearing. Size: 8 dress/ thin orthotic with flexible metal core Base: Black taper mod density EVA Posting: none Additional orthotic padding: none

## 2016-03-12 ENCOUNTER — Ambulatory Visit
Admission: RE | Admit: 2016-03-12 | Discharge: 2016-03-12 | Disposition: A | Discharge status: Home / Self Care | Payer: BC Managed Care – PPO | Source: Ambulatory Visit | Attending: Unknown Physician Specialty | Admitting: Unknown Physician Specialty

## 2016-03-12 ENCOUNTER — Other Ambulatory Visit: Payer: Self-pay | Admitting: Unknown Physician Specialty

## 2016-03-12 DIAGNOSIS — J984 Other disorders of lung: Secondary | ICD-10-CM | POA: Diagnosis not present

## 2016-03-12 DIAGNOSIS — R05 Cough: Secondary | ICD-10-CM | POA: Diagnosis present

## 2016-03-12 DIAGNOSIS — R059 Cough, unspecified: Secondary | ICD-10-CM

## 2016-04-16 ENCOUNTER — Ambulatory Visit: Payer: BC Managed Care – PPO | Admitting: Sports Medicine

## 2016-05-21 ENCOUNTER — Ambulatory Visit (INDEPENDENT_AMBULATORY_CARE_PROVIDER_SITE_OTHER): Payer: BC Managed Care – PPO | Admitting: Sports Medicine

## 2016-05-21 DIAGNOSIS — M79604 Pain in right leg: Secondary | ICD-10-CM | POA: Diagnosis not present

## 2016-05-21 DIAGNOSIS — M25552 Pain in left hip: Secondary | ICD-10-CM

## 2016-05-21 DIAGNOSIS — L84 Corns and callosities: Secondary | ICD-10-CM | POA: Diagnosis not present

## 2016-05-21 NOTE — Assessment & Plan Note (Signed)
Corn on right fourth toe likely due to mid foot collapse and mid foot play - wider foot orthosis given today - patient to wear corn pads

## 2016-05-21 NOTE — Assessment & Plan Note (Addendum)
Right mid thigh pain in the absence of radiation from her back. 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

## 2016-05-21 NOTE — Progress Notes (Signed)
   Subjective:    Patient ID: Caitlin SandiferSusan Loy Margraf, female    DOB: 08/29/1955, 61 y.o.   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  Left hip pain - pain on left outside of hip - present for the last month - occurs with prolonged standing - does not occur with walking  Right Toe Pain - right fourth toe with callous which she feels pushing her left small toe Orthotics have helped overall foot pain   Pertinent past medical history: Chronic right foot pain  Review of Systems No Sciatica No swelling in 4th or 5th toe on Rt   Objective:  BP 106/60   Ht 5\' 7"  (1.702 m)   Wt 125 lb (56.7 kg)   BMI 19.58 kg/m  Vitals and nursing note reviewed  General: NAD MSK Equal leg length Normal hip ROM and strength No tenderness to palpation of the hips but she does have prominent greater trochanters bilaterally No hip effusion of swelling appreciated  RT thigh with no abnormality or Pain on palapation  Right foot Mid foot collapse with some collapse of the longitudnal arch fore foot splay between the third and fourth toes Right fourth toe with callous on the lateral aspect and spurring at lateral margin of DIP   Assessment & Plan:    Corn of toe Corn on right fourth toe likely due to mid foot collapse and mid foot play - wider foot orthosis given today - patient to wear corn pads  Left hip pain Hip pain with prolonged standing. She has great hip strength on exam today. Her hip pain is likely due to weaker hip stabilizing muscles with possible irritation of  the trochateric bursa - will give exercises today to help with hip pain  Right leg pain Right mid thigh pain in the absence of radiation from her back. 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  - she will be given stretching exerices to help reduce of her risk of nerve entrapment    Agapito Hanway A. Kennon RoundsHaney MD, MS Family Medicine Resident PGY-3 Pager [phone]

## 2016-05-21 NOTE — Assessment & Plan Note (Addendum)
Hip pain with prolonged standing. She has great hip strength on exam today. Her hip pain is likely due to weaker hip stabilizing muscles with possible irritation of the trochateric bursa or gluteus medius tendon insertion - will give exercises today to help with hip pain

## 2016-05-21 NOTE — Patient Instructions (Signed)
Use corn pad on sore right fourth toe Do exercises as prescribed, specifically the hip rotation exercise Follow up as needed

## 2016-06-21 ENCOUNTER — Telehealth: Payer: Self-pay | Admitting: *Deleted

## 2016-06-21 NOTE — Telephone Encounter (Signed)
Talked to patient and the pain was not happening at the moment. She will update [place] if the pain reoccurs and becomes more persistent

## 2016-11-14 ENCOUNTER — Ambulatory Visit: Payer: BC Managed Care – PPO | Admitting: Sports Medicine

## 2016-11-21 ENCOUNTER — Encounter: Payer: Self-pay | Admitting: Sports Medicine

## 2016-11-21 ENCOUNTER — Ambulatory Visit (INDEPENDENT_AMBULATORY_CARE_PROVIDER_SITE_OTHER): Payer: BC Managed Care – PPO | Admitting: Sports Medicine

## 2016-11-21 DIAGNOSIS — M79671 Pain in right foot: Secondary | ICD-10-CM | POA: Diagnosis not present

## 2016-11-21 MED ORDER — FLUOCINONIDE 0.05 % EX OINT: 1.0000 "application " | TOPICAL_OINTMENT | Freq: Two times a day (BID) | CUTANEOUS | 0 refills | Status: DC

## 2016-11-21 NOTE — Assessment & Plan Note (Signed)
With the midfoot DJD she is now developing more DJD of MTP 1 on RT  I tried to modify custom orthotic with cutout for MTP 1  Start trying to soften the 5th toe callus and then file this  May try different strategy with podiatry consult if not improving

## 2016-11-21 NOTE — Progress Notes (Deleted)
   HPI  CC: Metatarsalgia, right Patient is here to follow-up on her right-sided metatarsalgia. She states that she continues to have some discomfort in the right foot. She was provided custom orthotics with a metatarsal pad which seems to have dramatically helped much of her discomfort within her hips and legs. 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 Patient denies any recent setbacks. No recent falls, trauma, or injury. Denies any weakness, numbness, or paresthesias.  Medications/Interventions Tried: custom orthotics.  See HPI and/or previous note for associated ROS.  Objective: BP 114/72   Ht  (1.702 m)   Wt 127 lb (57.6 kg)   BMI 19.89 kg/m  Gen: ***-Hand Dominant. NAD, well groomed, a/o x3, normal affect.  CV: Well-perfused. Warm.  Resp: Non-labored.  Neuro: Sensation intact throughout. No*** gross coordination deficits.  Gait: ***Nonpathologic posture, ***unremarkable stride without signs of limp or balance issues. ***  Assessment and plan:  No problem-specific Assessment & Plan notes found for this encounter.   No orders of the defined types were placed in this encounter.   No orders of the defined types were placed in this encounter.    Kathee Delton, MD,MS [facility] Sports Medicine Fellow 11/21/2016 11:52 AM

## 2016-11-21 NOTE — Progress Notes (Signed)
CC: RT foot pain  We have tried custom orthotic to relieve pain of RT hallux rigidus This has gotten more painful Custom orthotics did not fit well into equestrian boots and has not been using  Pain starts at RT first MTP CAn be plantar or dorsal Also pain and blistering over 5th toe callus plantar  Pain worsened after a lot of standing at [employer]  ROS No sciatica Hx of RT leg pain and DJD knee  PE Thin F in NAD BP 114/72   Ht  (1.702 m)   Wt 127 lb (57.6 kg)   BMI 19.89 kg/m   RT Thickend MTP 1 with spurring Hallux rigidus with limited motion TTP over first MTP Hard callus under pad of 5th toe Arch long moderate Transverse arch with some flattening

## 2016-12-31 IMAGING — DX DG CHEST 2V
2 series · 2 of 2 positions shown · non-contrast
Comparison: 03/29/2015

CLINICAL DATA: Dry cough 8 weeks.

EXAM:
CHEST  2 VIEW

[chest pa]
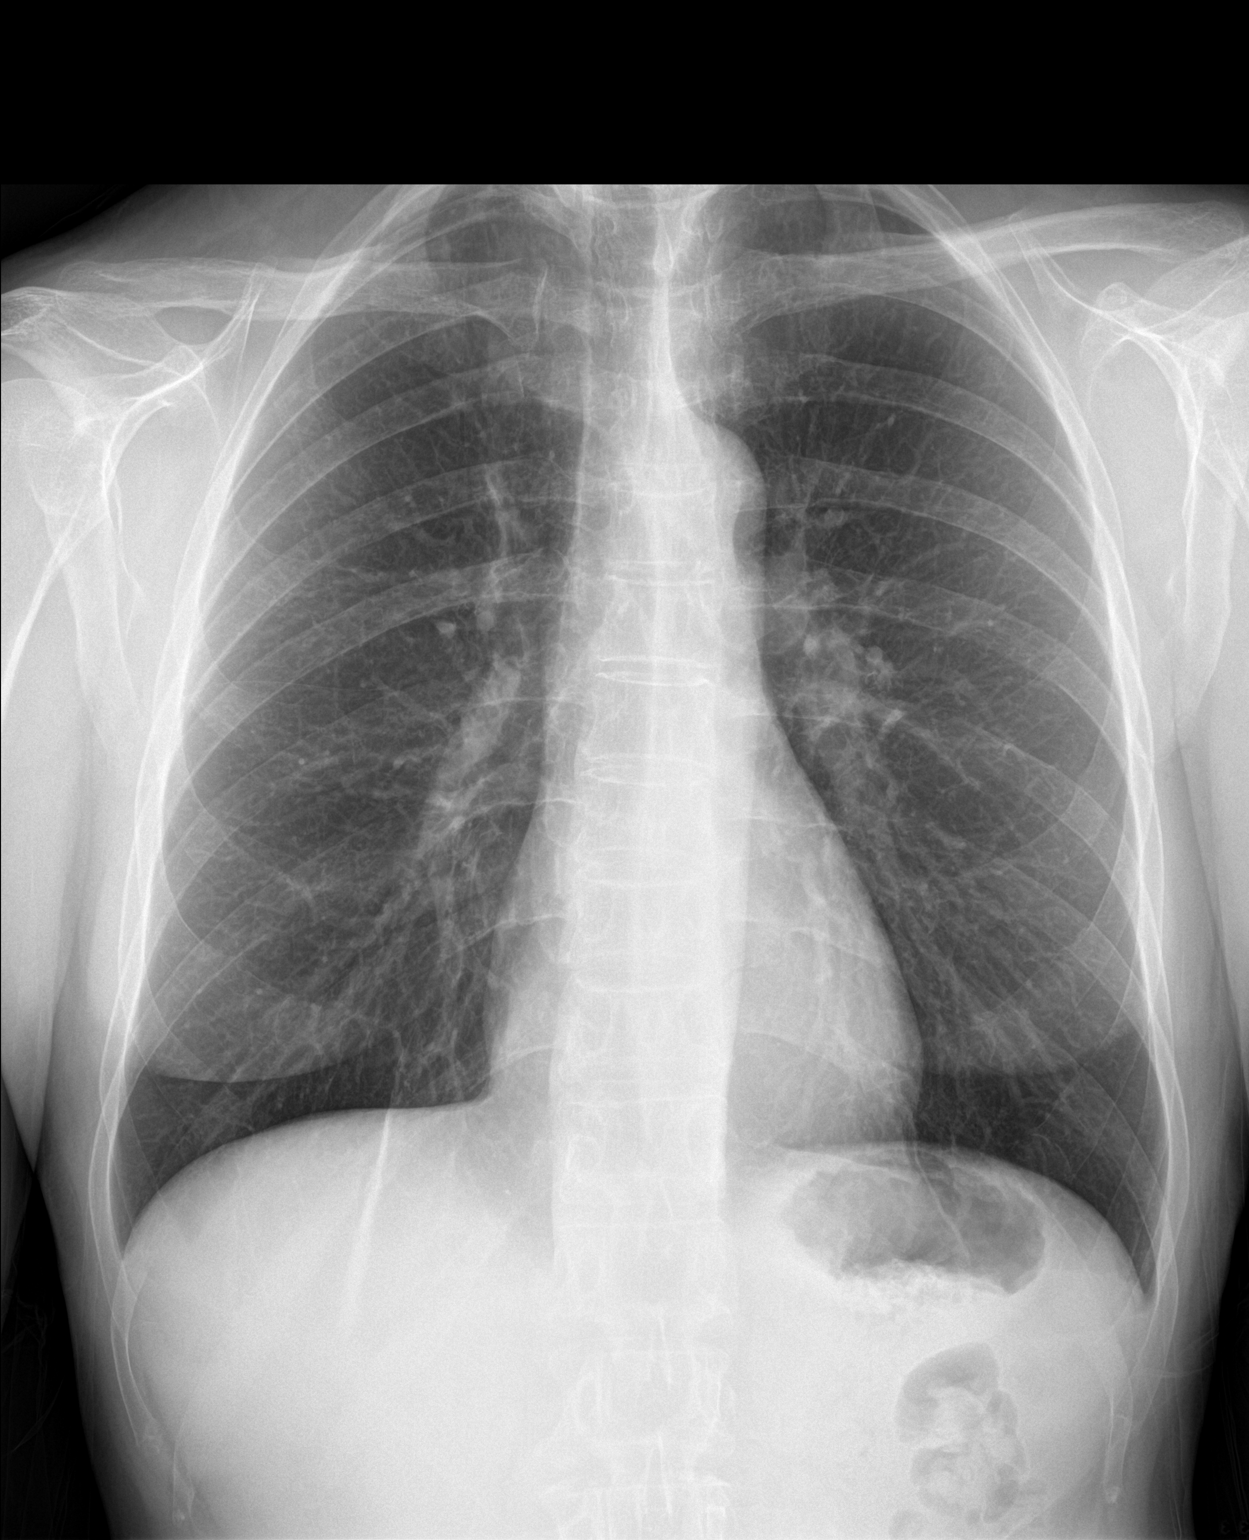

[chest lat]
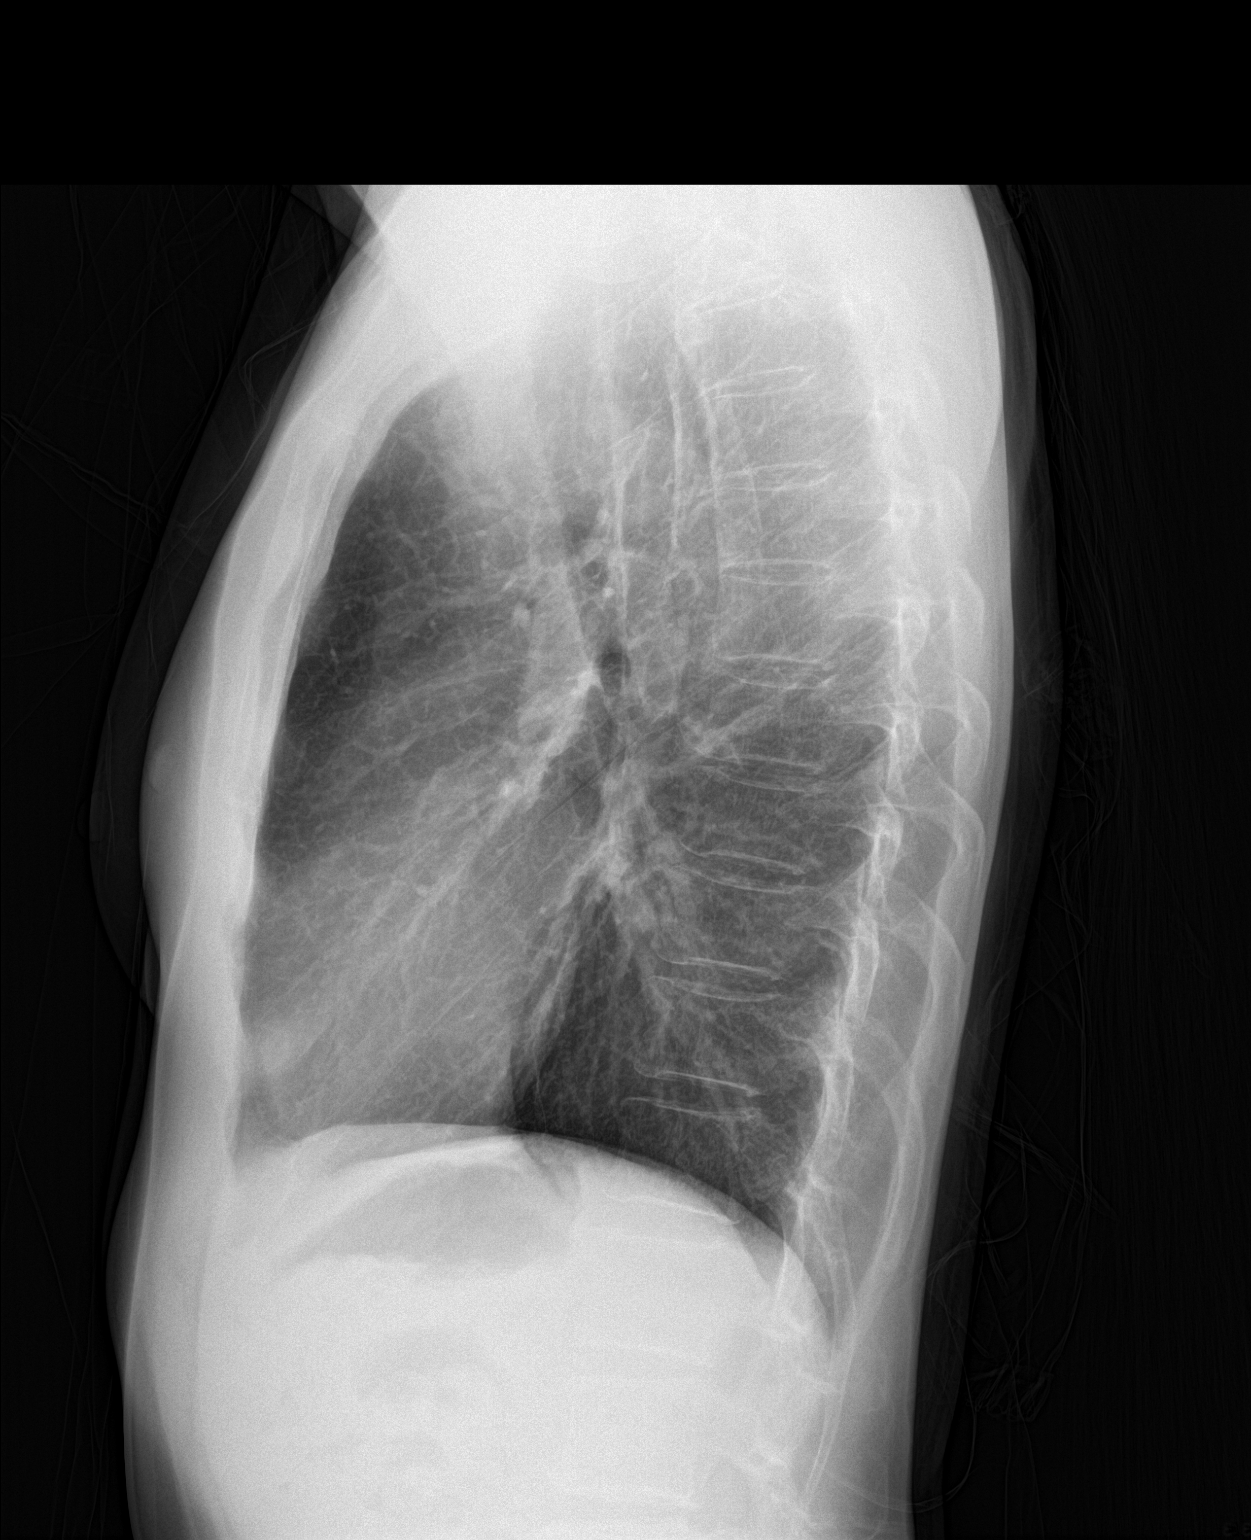

[2 of 2 positions shown; findings below may reference images not displayed]

FINDINGS: Lungs are hyperinflated without focal consolidation or effusion.
Linear scarring right base. Cardiomediastinal silhouette is within
normal. Remaining bones and soft tissues are within normal.
IMPRESSION: No active cardiopulmonary disease.

## 2017-04-03 ENCOUNTER — Encounter: Payer: Self-pay | Admitting: Sports Medicine

## 2017-04-03 ENCOUNTER — Ambulatory Visit: Payer: BC Managed Care – PPO | Admitting: Sports Medicine

## 2017-04-03 VITALS — BP 139/71 | HR 70 | Ht 67.0 in | Wt 130.0 lb

## 2017-04-03 DIAGNOSIS — M2021 Hallux rigidus, right foot: Secondary | ICD-10-CM | POA: Diagnosis not present

## 2017-04-03 NOTE — Progress Notes (Signed)
   Subjective:    Patient ID: Caitlin Murray, female    DOB: 05/04/1955, 62 y.o.   MRN: [id number]  HPI Caitlin Murray is a 62 year old female who presents for follow up of right great toe hallux rigidus. She has been seen several times in attempts to make changes to her orthotics to fit in her riding boots. Her customized orthotic does not fit well in her boots so she has not been wearing them. She has been wearing the orthotics that came in her boots with added padding of the first ray as well as a carbon fiber sole support. She states that this orthotic has improved her pain some, but she still has daily pain during and after riding. For the past few day she has had increased right great toe pain and swelling. She denies any erythema. She took some duexis with improvement of her pain. She presents today to discuss further management options.   Review of Systems Negative except as stated above.    Objective:   Physical Exam General: Well appearing, no acute distress Right great toe: Moderate evidence of arthritic changes. No erythema. Decreased flexion and extension compared to left. Mild tenderness to palpation of interphalangeal and MTP joints. No tenderness of any other metatarsals.        Assessment & Plan:  62 year old female who presents for follow up of right great toe hallux rigidus. We have tried multiple orthotics, however she is unable to fit custom orthotics in her boots. She is currently using the orthotics that came in her boots with added padding of the second metatarsal as well as a carbon fiber sole support. We discussed options for further treatment. Given the patient's symptoms and daily use of riding boots, she may benefit most from a more rigid custom orthotic with first metatarsal support. Therefore, a consult to [facility] will be placed. We also added the same first ray padding and carbon fiber support to her new boot insole for use until she is able to  see podiatry. Other future options include considering a custom riding boot. Patient will follow up as needed.

## 2017-04-14 ENCOUNTER — Ambulatory Visit (INDEPENDENT_AMBULATORY_CARE_PROVIDER_SITE_OTHER): Payer: BC Managed Care – PPO | Admitting: Podiatry

## 2017-04-14 ENCOUNTER — Ambulatory Visit (INDEPENDENT_AMBULATORY_CARE_PROVIDER_SITE_OTHER): Payer: BC Managed Care – PPO

## 2017-04-14 ENCOUNTER — Encounter: Payer: Self-pay | Admitting: Podiatry

## 2017-04-14 DIAGNOSIS — M205X1 Other deformities of toe(s) (acquired), right foot: Secondary | ICD-10-CM

## 2017-04-14 DIAGNOSIS — M2021 Hallux rigidus, right foot: Secondary | ICD-10-CM | POA: Diagnosis not present

## 2017-04-14 DIAGNOSIS — M7751 Other enthesopathy of right foot: Secondary | ICD-10-CM | POA: Diagnosis not present

## 2017-04-14 NOTE — Progress Notes (Signed)
   Subjective:    Patient ID: Caitlin Murray, female    DOB: 10/09/1955, 62 y.o.   MRN: [id number]  HPI    Review of Systems  All other systems reviewed and are negative.      Objective:   Physical Exam        Assessment & Plan:

## 2017-04-15 NOTE — Progress Notes (Signed)
   HPI: 62 year old female presenting today as a new patient with a chief complaint of pain to the 1st MPJ of the right foot that has been ongoing for the past few years. She has not done anything recently to treat the symptoms. Walking and bearing weight increases the pain. She denies alleviating factors. Patient is here for further evaluation and treatment.   Past Medical History:  Diagnosis Date  . Asthma   . GERD (gastroesophageal reflux disease)   . Osteopenia      Physical Exam: General: The patient is alert and oriented x3 in no acute distress.  Dermatology: Skin is warm, dry and supple bilateral lower extremities. Negative for open lesions or macerations.  Vascular: Palpable pedal pulses bilaterally. No edema or erythema noted. Capillary refill within normal limits.  Neurological: Epicritic and protective threshold grossly intact bilaterally.   Musculoskeletal Exam: Pain on palpation with limited range of motion noted to the first MPJ right foot. Range of motion within normal limits to all pedal and ankle joints bilateral. Muscle strength 5/5 in all groups bilateral.   Radiographic Exam: Degenerative changes noted with joint space narrowing first MPJ. There also appears to be extra-articular spurring noted about the joint.    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 X-Rays reviewed. - Injection of 0.5 mLs Celestone Soluspan injected into the 1st MPJ of the right foot.  - Appointment with Raiford Nobleick for custom molded orthotics. - Discussed surgical versus conservative treatment. Patient would like to pursue conservative treatment at the moment. -Return to clinic in 8 weeks.   She is a Paediatric nursehorse trainer.    Felecia ShellingBrent M. Harim Bi, DPM [facility]  Dr. Felecia ShellingBrent M. Jessah Danser, DPM    [address]                                        [place], [place] [address]                Office [phone]  Fax [phone])  [phone]

## 2017-04-22 ENCOUNTER — Ambulatory Visit (INDEPENDENT_AMBULATORY_CARE_PROVIDER_SITE_OTHER): Payer: BC Managed Care – PPO | Admitting: Orthotics

## 2017-04-22 DIAGNOSIS — M2021 Hallux rigidus, right foot: Secondary | ICD-10-CM

## 2017-04-22 NOTE — Progress Notes (Signed)
Patient is here today to be evaluated and cast for CMFO.  Patient has hx of functional hallux limitus (FHL), and needs a supportive orthoses that will plantarflex first ray in order to lower hinge pin of first MPJ and enhance windless effect.  Plan of deep heel cup, hug arch, and reverse mortons extension.  Richy to fab.   IF we do not get Best Possible Outcome w/ Reverse Morton's, as alternative we will try rigid morton's extension for right foot.

## 2017-05-19 ENCOUNTER — Ambulatory Visit: Payer: BC Managed Care – PPO | Admitting: Orthotics

## 2017-05-19 DIAGNOSIS — M7751 Other enthesopathy of right foot: Secondary | ICD-10-CM

## 2017-05-19 DIAGNOSIS — M205X1 Other deformities of toe(s) (acquired), right foot: Secondary | ICD-10-CM

## 2017-05-19 NOTE — Progress Notes (Signed)
Patient came in today to pick up custom made foot orthotics.  The goals were accomplished and the patient reported no dissatisfaction with said orthotics.  Patient was advised of breakin period and how to report any issues. 

## 2017-06-09 ENCOUNTER — Ambulatory Visit: Payer: BC Managed Care – PPO | Admitting: Podiatry

## 2017-08-19 ENCOUNTER — Ambulatory Visit: Payer: BC Managed Care – PPO | Admitting: Sports Medicine

## 2017-08-19 ENCOUNTER — Encounter: Payer: Self-pay | Admitting: Sports Medicine

## 2017-08-19 VITALS — BP 104/60 | Ht 67.0 in | Wt 130.0 lb

## 2017-08-19 DIAGNOSIS — M810 Age-related osteoporosis without current pathological fracture: Secondary | ICD-10-CM

## 2017-08-19 DIAGNOSIS — M2021 Hallux rigidus, right foot: Secondary | ICD-10-CM | POA: Diagnosis not present

## 2017-08-19 NOTE — Patient Instructions (Signed)
Dr Kriste BasqueBecky Nani RavensBassett [facility] [address] [place] [address] Thurs 08/28/17 at 945a [phone]

## 2017-08-20 ENCOUNTER — Encounter: Payer: Self-pay | Admitting: Sports Medicine

## 2017-08-20 NOTE — Addendum Note (Signed)
Addended by: Shirlean KellyFARRAR, CLARISSA J on: 08/20/2017 01:35 PM   Modules accepted: Orders

## 2017-08-20 NOTE — Progress Notes (Addendum)
  Patient comes in today for a couple of different reasons. Primary reason for today's visit is to discuss a recent diagnosis of osteoporosis. A recent DEXA scan showed osteoporosis of the lumbar spine (T score is -3.0) and osteopenia of the left proximal femur. She was advised to start Fosamax but she is concerned about the side effects. An endocrinologist appointment has been scheduled but she wanted my opinion regarding treatment. She is very active and wants to remain active, particularly in regards to riding horses. I explained to her that osteoporosis treatment is not one of my specialties but that I would be happy to arrange for an appointment with Dr. Lendon ColonelBecky Bassett in her osteoporosis clinic at [facility]. Patient would be interested in speaking with Dr. Cleophas DunkerBassett. Per Dr. Hoy RegisterBassett's request, we will order some lab work prior to her appointment.  She is also here today for me to evaluate her custom orthotics. She had some orthotics made at a podiatry office. She is still having right great toe pain due to her hallux rigidus. I tried a couple of different off-the-shelf orthotics today with first ray posts but she was not convinced that it made her toe any more comfortable. I've advised her to return to podiatry to see if they can modify her current custom orthotics. She is in agreement with that plan.  Total time spent with the patient was 10 minutes with greater than 50% of the time spent in face-to-face consultation discussing referral to Dr. Cleophas DunkerBassett as well as discussing orthotic correction. Follow-up as needed.

## 2017-08-20 NOTE — Addendum Note (Signed)
Addended by: Annita BrodMOORE, Maymie Brunke C on: 08/20/2017 11:04 AM   Modules accepted: Orders

## 2017-08-21 LAB — COMPREHENSIVE METABOLIC PANEL
ALBUMIN: 4.1 g/dL (ref 3.6–4.8)
ALK PHOS: 134 IU/L — AB (ref 39–117)
ALT: 12 IU/L (ref 0–32)
AST: 14 IU/L (ref 0–40)
Albumin/Globulin Ratio: 1.8 (ref 1.2–2.2)
BILIRUBIN TOTAL: 0.2 mg/dL (ref 0.0–1.2)
BUN / CREAT RATIO: 25 (ref 12–28)
BUN: 23 mg/dL (ref 8–27)
CHLORIDE: 107 mmol/L — AB (ref 96–106)
CO2: 23 mmol/L (ref 20–29)
Calcium: 9.4 mg/dL (ref 8.7–10.3)
Creatinine, Ser: 0.92 mg/dL (ref 0.57–1.00)
GFR calc Af Amer: 78 mL/min/{1.73_m2} (ref >59)
GFR calc non Af Amer: 67 mL/min/{1.73_m2} (ref >59)
GLUCOSE: 90 mg/dL (ref 65–99)
Globulin, Total: 2.3 g/dL (ref 1.5–4.5)
Potassium: 4.5 mmol/L (ref 3.5–5.2)
SODIUM: 144 mmol/L (ref 134–144)
Total Protein: 6.4 g/dL (ref 6.0–8.5)

## 2017-08-21 LAB — PTH, INTACT AND CALCIUM: PTH: 54 pg/mL (ref 15–65)

## 2017-10-14 IMAGING — CR DG CHEST 2V
1 series · 2 of 2 positions shown · non-contrast
Comparison: Chest x-ray of 05/30/2015

CLINICAL DATA: Cough for several months, history of asthma

EXAM:
CHEST  2 VIEW

[Series 1: w chest pa · 0.14mm/px · 2 of 2 slices shown]
[im 1/2]
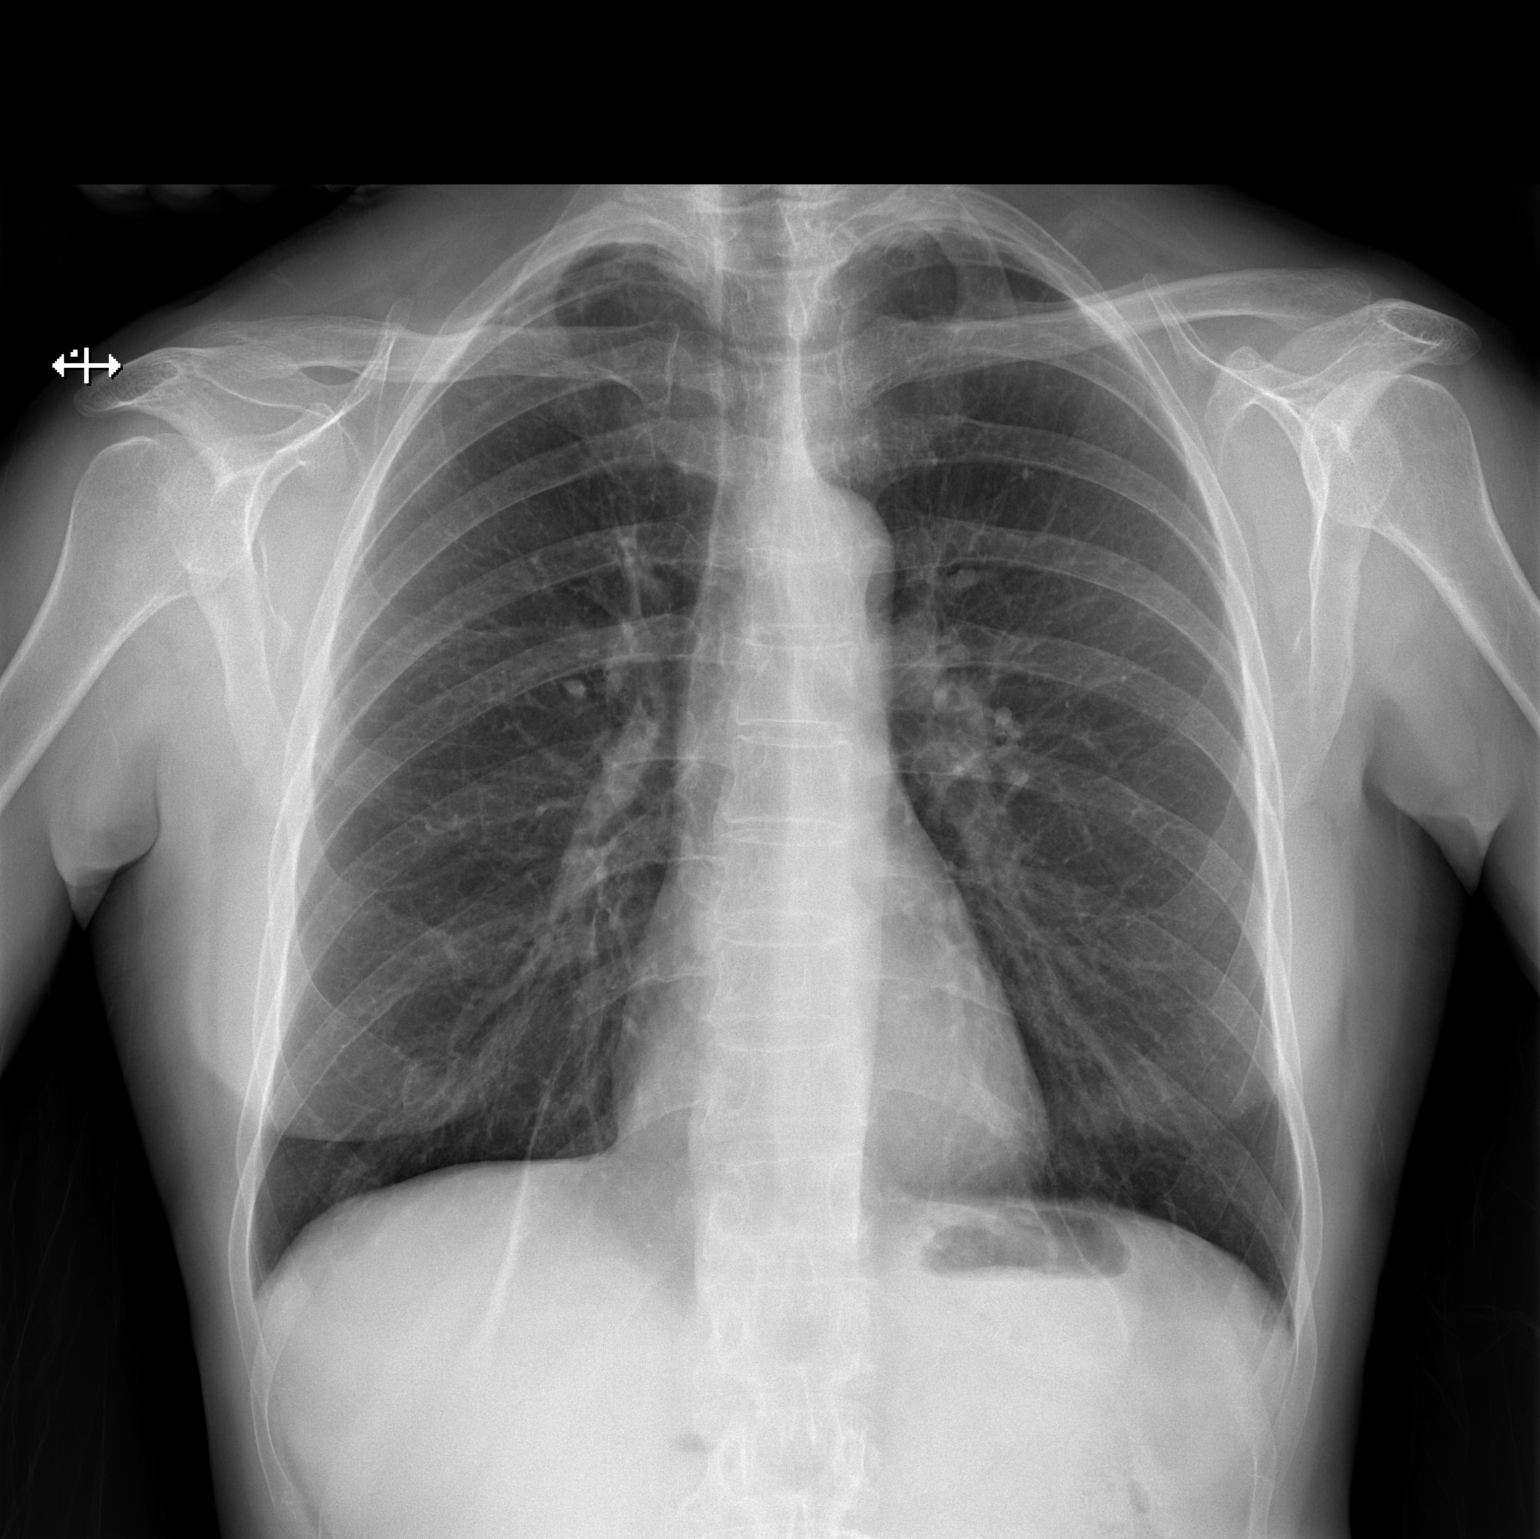
[im 2/2]
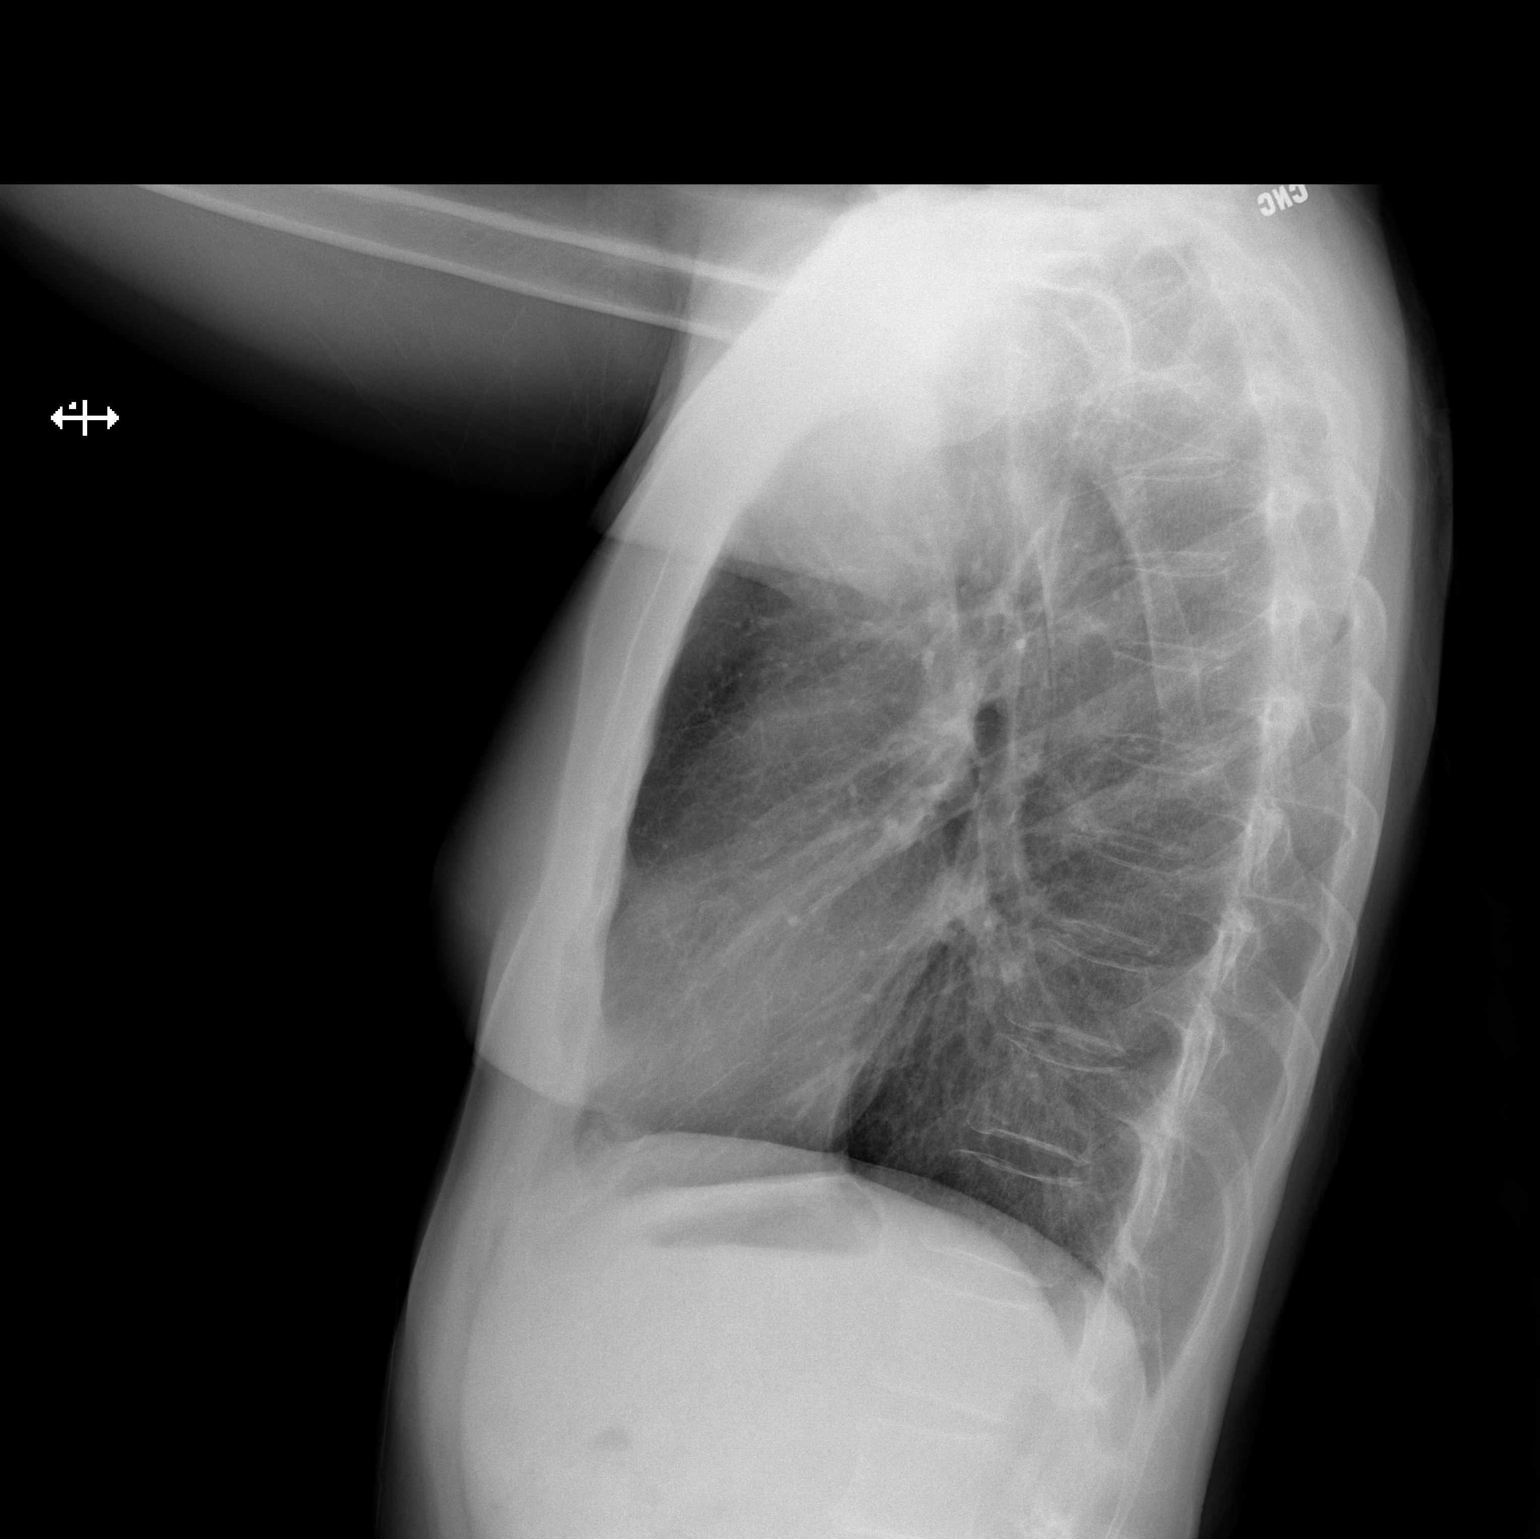

[2 of 2 positions shown; findings below may reference images not displayed]

FINDINGS: The lungs are clear but remain somewhat hyperaerated. Linear
scarring the right lung base is stable. Mediastinal and hilar
contours are unremarkable. The heart is within normal limits in
size. No bony abnormality is seen.
IMPRESSION: No change in hyper aeration and linear scarring at the right lung
base. No active lung disease.

## 2018-02-23 ENCOUNTER — Ambulatory Visit: Payer: BC Managed Care – PPO | Admitting: Orthotics

## 2018-02-23 DIAGNOSIS — M7751 Other enthesopathy of right foot: Secondary | ICD-10-CM

## 2018-02-23 NOTE — Progress Notes (Signed)
Sending f/o back for Caitlin Murray to recover under warranty.

## 2018-10-21 ENCOUNTER — Other Ambulatory Visit: Payer: Self-pay

## 2018-10-21 ENCOUNTER — Encounter: Payer: Self-pay | Admitting: Neurology

## 2018-10-21 DIAGNOSIS — R531 Weakness: Secondary | ICD-10-CM

## 2018-11-26 ENCOUNTER — Encounter: Payer: BC Managed Care – PPO | Admitting: Neurology

## 2019-01-22 ENCOUNTER — Telehealth: Payer: Self-pay | Admitting: Podiatry

## 2019-01-22 NOTE — Telephone Encounter (Signed)
I would like my medical records and x-rays sent to Dr. Doran Durand at [facility].

## 2019-01-22 NOTE — Telephone Encounter (Signed)
Called pt to let her know she would need to fill out and sign a medical records release form authorizing [place] to release her records to Dr. Doran Durand before I could send them. Pt stated she would be by the office in the next week to fill out and sign the release form.

## 2020-08-04 ENCOUNTER — Ambulatory Visit: Payer: BC Managed Care – PPO | Admitting: Podiatry

## 2022-07-04 ENCOUNTER — Other Ambulatory Visit: Payer: Self-pay

## 2022-07-04 DIAGNOSIS — M810 Age-related osteoporosis without current pathological fracture: Secondary | ICD-10-CM

## 2022-07-17 ENCOUNTER — Telehealth: Payer: Self-pay | Admitting: Pharmacy Technician

## 2022-07-17 ENCOUNTER — Encounter: Payer: Self-pay | Admitting: Sports Medicine

## 2022-07-17 NOTE — Telephone Encounter (Addendum)
Auth Submission: NO AUTH NEEDED Site of care: Site of care: [facility] Payer: [employer] Medication & CPT/J Code(s) submitted: Prolia (Denosumab) [id number] Route of submission (phone, fax, portal):  Phone # Fax # Auth type: Buy/Bill Units/visits requested: X2 Reference number: [id number] Approval from: 07/17/22 to 04/03/24

## 2022-07-22 ENCOUNTER — Encounter: Payer: Self-pay | Admitting: Sports Medicine

## 2022-07-23 ENCOUNTER — Ambulatory Visit (INDEPENDENT_AMBULATORY_CARE_PROVIDER_SITE_OTHER): Payer: PPO

## 2022-07-23 VITALS — BP 124/70 | HR 57 | Temp 97.3°F | Resp 18 | Ht 67.0 in | Wt 133.8 lb

## 2022-07-23 DIAGNOSIS — M810 Age-related osteoporosis without current pathological fracture: Secondary | ICD-10-CM | POA: Diagnosis not present

## 2022-07-23 MED ORDER — DENOSUMAB 60 MG/ML ~~LOC~~ SOSY
60.0000 mg | PREFILLED_SYRINGE | Freq: Once | SUBCUTANEOUS | Status: AC
  Administered 2022-07-23: 60 mg via SUBCUTANEOUS

## 2022-07-23 NOTE — Progress Notes (Signed)
Diagnosis: Osteoporosis  Provider:  Mannam, Praveen MD  Procedure: Injection  Prolia (Denosumab), Dose: 60 mg, Site: subcutaneous, Number of injections: 1  Post Care: Patient declined observation  Discharge: Condition: Good, Destination: Home . AVS Provided  Performed by:  Ayaana Biondo, RN       

## 2022-07-23 NOTE — Patient Instructions (Signed)
Denosumab Injection (Osteoporosis) What is this medication? DENOSUMAB (den oh SUE mab) prevents and treats osteoporosis. It works by making your bones stronger and less likely to break (fracture). It is a monoclonal antibody. This medicine may be used for other purposes; ask your health care provider or pharmacist if you have questions. COMMON BRAND NAME(S): Prolia What should I tell my care team before I take this medication? They need to know if you have any of these conditions: Dental or gum disease, or plan to have dental surgery or a tooth pulled Infection Kidney disease Low levels of calcium or vitamin D in your blood On dialysis Poor nutrition Skin conditions Thyroid disease, or have had thyroid or parathyroid surgery Trouble absorbing minerals in your stomach or intestine An unusual or allergic reaction to denosumab, other medications, foods, dyes, or preservatives Pregnant or trying to get pregnant Breastfeeding How should I use this medication? This medication is injected under the skin. It is given by your care team in a hospital or clinic setting. A special MedGuide will be given to you before each treatment. Be sure to read this information carefully each time. Talk to your care team about the use of this medication in children. Special care may be needed. Overdosage: If you think you have taken too much of this medicine contact a poison control center or emergency room at once. NOTE: This medicine is only for you. Do not share this medicine with others. What if I miss a dose? Keep appointments for follow-up doses. It is important not to miss your dose. Call your care team if you are unable to keep an appointment. What may interact with this medication? Do not take this medication with any of the following: Other medications that contain denosumab This medication may also interact with the following: Medications that lower your chance of fighting infection Steroid  medications, such as prednisone or cortisone This list may not describe all possible interactions. Give your health care provider a list of all the medicines, herbs, non-prescription drugs, or dietary supplements you use. Also tell them if you smoke, drink alcohol, or use illegal drugs. Some items may interact with your medicine. What should I watch for while using this medication? Your condition will be monitored carefully while you are receiving this medication. You may need blood work while taking this medication. This medication may increase your risk of getting an infection. Call your care team for advice if you get a fever, chills, sore throat, or other symptoms of a cold or flu. Do not treat yourself. Try to avoid being around people who are sick. Tell your dentist and dental surgeon that you are taking this medication. You should not have major dental surgery while on this medication. See your dentist to have a dental exam and fix any dental problems before starting this medication. Take good care of your teeth while on this medication. Make sure you see your dentist for regular follow-up appointments. You should make sure you get enough calcium and vitamin D while you are taking this medication. Discuss the foods you eat and the vitamins you take with your care team. Talk to your care team if you are pregnant or think you might be pregnant. This medication can cause serious birth defects if taken during pregnancy and for 5 months after the last dose. You will need a negative pregnancy test before starting this medication. Contraception is recommended while taking this medication and for 5 months after the last dose. Your care   team can help you find the option that works for you. Talk to your care team before breastfeeding. Changes to your treatment plan may be needed. What side effects may I notice from receiving this medication? Side effects that you should report to your care team as soon as  possible: Allergic reactions--skin rash, itching, hives, swelling of the face, lips, tongue, or throat Infection--fever, chills, cough, sore throat, wounds that don't heal, pain or trouble when passing urine, general feeling of discomfort or being unwell Low calcium level--muscle pain or cramps, confusion, tingling, or numbness in the hands or feet Osteonecrosis of the jaw--pain, swelling, or redness in the mouth, numbness of the jaw, poor healing after dental work, unusual discharge from the mouth, visible bones in the mouth Severe bone, joint, or muscle pain Skin infection--skin redness, swelling, warmth, or pain Side effects that usually do not require medical attention (report these to your care team if they continue or are bothersome): Back pain Headache Joint pain Muscle pain Pain in the hands, arms, legs, or feet Runny or stuffy nose Sore throat This list may not describe all possible side effects. Call your doctor for medical advice about side effects. You may report side effects to FDA at 1-800-FDA-1088. Where should I keep my medication? This medication is given in a hospital or clinic. It will not be stored at home. NOTE: This sheet is a summary. It may not cover all possible information. If you have questions about this medicine, talk to your doctor, pharmacist, or health care provider.  2023 Elsevier/Gold Standard (2021-07-02 00:00:00)  

## 2023-01-23 ENCOUNTER — Ambulatory Visit: Payer: PPO

## 2023-01-23 VITALS — BP 119/77 | HR 65 | Temp 97.8°F | Resp 16 | Ht 67.0 in | Wt 134.2 lb

## 2023-01-23 DIAGNOSIS — M810 Age-related osteoporosis without current pathological fracture: Secondary | ICD-10-CM

## 2023-01-23 MED ORDER — DENOSUMAB 60 MG/ML ~~LOC~~ SOSY
60.0000 mg | PREFILLED_SYRINGE | Freq: Once | SUBCUTANEOUS | Status: AC
  Administered 2023-01-23: 60 mg via SUBCUTANEOUS

## 2023-01-23 NOTE — Progress Notes (Signed)
Diagnosis: Osteoporosis  Provider:  Chilton Greathouse MD  Procedure: Injection  Prolia (Denosumab), Dose: 60 mg, Site: subcutaneous, Number of injections: 1  Administered in upper left abdomen.  Post Care: Patient declined observation  Discharge: Condition: Good, Destination: Home . AVS Provided  Performed by:  Wyvonne Lenz, RN

## 2023-03-03 ENCOUNTER — Encounter: Payer: Self-pay | Admitting: Sports Medicine

## 2023-06-23 ENCOUNTER — Other Ambulatory Visit: Payer: Self-pay | Admitting: Sports Medicine

## 2023-06-24 ENCOUNTER — Telehealth: Payer: Self-pay

## 2023-06-24 NOTE — Telephone Encounter (Signed)
 Auth Submission: APPROVED Site of care: Site of care: [facility] Payer: [employer] Medication & CPT/J Code(s) submitted: Prolia  (Denosumab ) [id number] Route of submission (phone, fax, portal): fax Phone # Fax # [phone]  Auth type: Buy/Bill PB Units/visits requested: 60mg  x 1 dose Reference number: [id number] Approval from: 06/23/23 to 09/21/23

## 2023-07-24 ENCOUNTER — Telehealth: Payer: Self-pay

## 2023-07-24 ENCOUNTER — Ambulatory Visit (INDEPENDENT_AMBULATORY_CARE_PROVIDER_SITE_OTHER): Payer: PPO

## 2023-07-24 VITALS — BP 128/73 | HR 62 | Temp 96.9°F | Resp 16 | Ht 67.0 in | Wt 136.4 lb

## 2023-07-24 DIAGNOSIS — M810 Age-related osteoporosis without current pathological fracture: Secondary | ICD-10-CM | POA: Diagnosis not present

## 2023-07-24 MED ORDER — DENOSUMAB 60 MG/ML ~~LOC~~ SOSY
60.0000 mg | PREFILLED_SYRINGE | Freq: Once | SUBCUTANEOUS | Status: AC
  Administered 2023-07-24: 60 mg via SUBCUTANEOUS

## 2023-07-24 NOTE — Progress Notes (Signed)
 Diagnosis: Osteoporosis  Provider:  Mannam, Praveen MD  Procedure: Injection  Prolia  (Denosumab ), Dose: 60 mg, Site: subcutaneous, Number of injections: 1  Injection Site(s): Left lower quad. abdomen  Post Care: Patient declined observation  Discharge: Condition: Good, Destination: Home . AVS Declined  Performed by:  Rachelle Bue, RN

## 2023-07-24 NOTE — Telephone Encounter (Signed)
 Patient scheduled for Prolia  60mg  today. No recent calcium was on file. Called Dr. Deeann Fare office and spoke directly with Dr. Perri Brake she says pt had labs on 06/12/23 and calcium was 10.0, ok to give prolia .

## 2023-09-11 ENCOUNTER — Telehealth: Payer: Self-pay | Admitting: Pharmacy Technician

## 2023-09-11 NOTE — Telephone Encounter (Signed)
 Auth Submission: NO AUTH NEEDED Site of care: Site of care: [facility] Payer: [employer] Medication & CPT/J Code(s) submitted: Prolia  (Denosumab ) [id number] Diagnosis Code:  Route of submission (phone, fax, portal):  Phone # Fax # Auth type: Buy/Bill PB Units/visits requested: 60MG  Q6 MONTHS X2 DOSES Reference number:  Approval from: 09/11/23 to 03/03/24

## 2024-01-26 ENCOUNTER — Ambulatory Visit

## 2024-01-26 VITALS — BP 131/76 | HR 61 | Temp 97.8°F | Resp 18 | Ht 67.0 in | Wt 136.6 lb

## 2024-01-26 DIAGNOSIS — M810 Age-related osteoporosis without current pathological fracture: Secondary | ICD-10-CM | POA: Diagnosis not present

## 2024-01-26 MED ORDER — DENOSUMAB 60 MG/ML ~~LOC~~ SOSY
60.0000 mg | PREFILLED_SYRINGE | Freq: Once | SUBCUTANEOUS | Status: AC
  Administered 2024-01-26: 60 mg via SUBCUTANEOUS
  Filled 2024-01-26: qty 1

## 2024-01-26 NOTE — Progress Notes (Signed)
 Diagnosis: Osteoporosis  Provider:  Mannam, Praveen MD  Procedure: Injection  Prolia  (Denosumab ), Dose: 60 mg, Site: subcutaneous, Number of injections: 1  Injection Site(s): Left lower quad. abdomen  Post Care:    Discharge: Condition: Good, Destination: Home . AVS Provided  Performed by:  Annesha Delgreco, RN

## 2024-07-27 ENCOUNTER — Ambulatory Visit
